# Patient Record
Sex: Female | Born: 1990 | Race: Black or African American | Hispanic: No | Marital: Single | Smoking: Never smoker
Health system: Southern US, Community
[De-identification: ages and names within clinical notes are randomized; demographics above are authoritative.]

## PROBLEM LIST (undated history)

## (undated) DIAGNOSIS — N898 Other specified noninflammatory disorders of vagina: Secondary | ICD-10-CM

## (undated) DIAGNOSIS — N390 Urinary tract infection, site not specified: Secondary | ICD-10-CM

## (undated) DIAGNOSIS — T8859XA Other complications of anesthesia, initial encounter: Secondary | ICD-10-CM

## (undated) HISTORY — DX: Urinary tract infection, site not specified: N39.0

## (undated) HISTORY — DX: Other complications of anesthesia, initial encounter: T88.59XA

## (undated) HISTORY — PX: EYE SURGERY: SHX253

## (undated) HISTORY — PX: HIP SURGERY: SHX245

---
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
## 2013-07-19 HISTORY — PX: HIP SURGERY: SHX245

## 2014-10-14 DIAGNOSIS — S72302A Unspecified fracture of shaft of left femur, initial encounter for closed fracture: Secondary | ICD-10-CM | POA: Insufficient documentation

## 2014-10-14 DIAGNOSIS — S32401A Unspecified fracture of right acetabulum, initial encounter for closed fracture: Secondary | ICD-10-CM | POA: Insufficient documentation

## 2015-05-24 ENCOUNTER — Emergency Department (HOSPITAL_COMMUNITY)
Admission: EM | Admit: 2015-05-24 | Discharge: 2015-05-24 | Disposition: A | Discharge status: Home / Self Care | Payer: Medicaid Other | Attending: Emergency Medicine | Admitting: Emergency Medicine

## 2015-05-24 ENCOUNTER — Encounter (HOSPITAL_COMMUNITY): Payer: Self-pay | Admitting: Emergency Medicine

## 2015-05-24 DIAGNOSIS — Z794 Long term (current) use of insulin: Secondary | ICD-10-CM | POA: Insufficient documentation

## 2015-05-24 DIAGNOSIS — Z87411 Personal history of vaginal dysplasia: Secondary | ICD-10-CM | POA: Diagnosis not present

## 2015-05-24 DIAGNOSIS — S70361A Insect bite (nonvenomous), right thigh, initial encounter: Secondary | ICD-10-CM | POA: Diagnosis present

## 2015-05-24 DIAGNOSIS — E119 Type 2 diabetes mellitus without complications: Secondary | ICD-10-CM | POA: Insufficient documentation

## 2015-05-24 DIAGNOSIS — L02415 Cutaneous abscess of right lower limb: Secondary | ICD-10-CM | POA: Insufficient documentation

## 2015-05-24 DIAGNOSIS — Y998 Other external cause status: Secondary | ICD-10-CM | POA: Diagnosis not present

## 2015-05-24 DIAGNOSIS — Y9289 Other specified places as the place of occurrence of the external cause: Secondary | ICD-10-CM | POA: Insufficient documentation

## 2015-05-24 DIAGNOSIS — L0291 Cutaneous abscess, unspecified: Secondary | ICD-10-CM

## 2015-05-24 DIAGNOSIS — W57XXXA Bitten or stung by nonvenomous insect and other nonvenomous arthropods, initial encounter: Secondary | ICD-10-CM | POA: Insufficient documentation

## 2015-05-24 DIAGNOSIS — Y9389 Activity, other specified: Secondary | ICD-10-CM | POA: Insufficient documentation

## 2015-05-24 HISTORY — DX: Other specified noninflammatory disorders of vagina: N89.8

## 2015-05-24 LAB — CBG MONITORING, ED: GLUCOSE-CAPILLARY: 52 mg/dL — AB (ref 65–99)

## 2015-05-24 MED ORDER — CEPHALEXIN 500 MG PO CAPS: 500.0000 mg | ORAL_CAPSULE | Freq: Four times a day (QID) | ORAL | Status: DC

## 2015-05-24 MED ORDER — LIDOCAINE-EPINEPHRINE (PF) 2 %-1:200000 IJ SOLN: 10.0000 mL | Freq: Once | INTRAMUSCULAR | Status: DC

## 2015-05-24 MED ORDER — IBUPROFEN 800 MG PO TABS: 800.0000 mg | ORAL_TABLET | Freq: Three times a day (TID) | ORAL | Status: DC

## 2015-05-24 MED ORDER — LIDOCAINE-EPINEPHRINE 2 %-1:100000 IJ SOLN
INTRAMUSCULAR | Status: AC
  Administered 2015-05-24: 1 mL
  Filled 2015-05-24: qty 1

## 2015-05-24 NOTE — ED Notes (Signed)
CBG 52. Snack given . Pt continues to be alert, oriented and laughing with friend in room. Snack mgiven  Pt advised x3 to remove young child from rolling chair

## 2015-05-24 NOTE — ED Notes (Signed)
Red raised abscess in r/upper thigh, increasing over last 3 days. Cellulitis approx 5 cm diameter. Abscess of 1.5cm in center. Pt reports drainage from wound over the 12 hours

## 2015-05-24 NOTE — ED Provider Notes (Signed)
CSN: [id number]     Arrival date & time 05/24/15  1103 History   First MD Initiated Contact with Patient 05/24/15 1111     Chief Complaint  Patient presents with  . Insect Bite    pt reports 3 day hx of red raised area on r/upper thigh     HPI   Lisa Barnes is a 24 y.o. female with a PMH of DM who presents to the ED with insect bite to her right upper thigh. She reports she thinks she was bitten by an insect 3-4 days ago, and has developed worsening pain and swelling since that time. She denies exacerbating factors. She has tried hot showers and "pressing" on her wound to drain it for symptom relief. She reports it has been draining "pus" for the past several days. She denies fever, chills, abdominal pain, N/V, numbness, weakness, paresthesia.   Past Medical History  Diagnosis Date  . Diabetes mellitus   . Vaginal cysts    Past Surgical History  Procedure Laterality Date  . Cesarean section    . Hip surgery      r/hip  . Eye surgery     Family History  Problem Relation Age of Onset  . Cancer Mother   . Hypertension Father    Social History  Substance Use Topics  . Smoking status: Never Smoker   . Smokeless tobacco: None  . Alcohol Use: No   OB History    No data available      Review of Systems  Constitutional: Negative for fever and chills.  Gastrointestinal: Negative for nausea, vomiting and abdominal pain.  Skin: Positive for color change and wound.  Neurological: Negative for weakness and numbness.      Allergies  Bactrim  Home Medications   Prior to Admission medications   Medication Sig Start Date End Date Taking? Authorizing Provider  cephALEXin (KEFLEX) 500 MG capsule Take 1 capsule (500 mg total) by mouth 4 (four) times daily. 05/24/15   Mady GemmaElizabeth C Westfall, PA-C  ibuprofen (ADVIL,MOTRIN) 800 MG tablet Take 1 tablet (800 mg total) by mouth 3 (three) times daily. 05/24/15   Mady GemmaElizabeth C Westfall, PA-C  insulin NPH (HUMULIN N,NOVOLIN N) 100 UNIT/ML  injection Inject 10-15 Units into the skin daily. ReliOn Insulin. Sliding Scale    Historical Provider, MD  insulin regular (NOVOLIN R,HUMULIN R) 100 units/mL injection Inject 10-15 Units into the skin 3 (three) times daily before meals. ReliOn Insulin. Sliding Scale    Historical Provider, MD    BP 109/72 mmHg  Pulse 94  Temp(Src) 97.9 F (36.6 C) (Oral)  Resp 16  Wt 152 lb (68.947 kg)  SpO2 100%  LMP 05/09/2015 (Approximate) Physical Exam  Constitutional: She is oriented to person, place, and time. She appears well-developed and well-nourished. No distress.  HENT:  Head: Normocephalic and atraumatic.  Right Ear: External ear normal.  Left Ear: External ear normal.  Nose: Nose normal.  Mouth/Throat: Oropharynx is clear and moist.  Eyes: Conjunctivae and EOM are normal. Pupils are equal, round, and reactive to light. Right eye exhibits no discharge. Left eye exhibits no discharge. No scleral icterus.  Neck: Normal range of motion. Neck supple.  Cardiovascular: Normal rate and regular rhythm.   Pulmonary/Chest: Effort normal and breath sounds normal. No respiratory distress.  Musculoskeletal: Normal range of motion. She exhibits tenderness. She exhibits no edema.  1.5 cm area of induration to right anterior upper thigh with 5 cm area surrounding erythema. Full range of motion of  right lower extremity. Strength and sensation intact. Distal pulses intact.  Neurological: She is alert and oriented to person, place, and time.  Skin: Skin is warm and dry. She is not diaphoretic. There is erythema.  Psychiatric: She has a normal mood and affect. Her behavior is normal.  Nursing note and vitals reviewed.   ED Course  .Marland KitchenIncision and Drainage Date/Time: 05/24/2015 12:39 PM Performed by: Glean Hess C Authorized by: Glean Hess C Consent: Verbal consent obtained. Risks and benefits: risks, benefits and alternatives were discussed Consent given by: patient Patient  understanding: patient states understanding of the procedure being performed Patient consent: the patient's understanding of the procedure matches consent given Procedure consent: procedure consent matches procedure scheduled Relevant documents: relevant documents present and verified Site marked: the operative site was marked Required items: required blood products, implants, devices, and special equipment available Patient identity confirmed: verbally with patient Time out: Immediately prior to procedure a "time out" was called to verify the correct patient, procedure, equipment, support staff and site/side marked as required. Type: abscess Body area: lower extremity Location details: right leg Anesthesia: local infiltration Local anesthetic: lidocaine 1% with epinephrine Anesthetic total: 8 ml Patient sedated: no Scalpel size: 11 Needle gauge: 22 Incision type: single straight Incision depth: dermal Complexity: complex Drainage: purulent Drainage amount: scant Wound treatment: wound left open Packing material: none Patient tolerance: Patient tolerated the procedure well with no immediate complications   Labs Review Labs Reviewed  CBG MONITORING, ED - Abnormal; Notable for the following:    Glucose-Capillary 52 (*)    All other components within normal limits    Imaging Review No results found.   I have personally reviewed and evaluated these lab results as part of my medical decision-making.   EKG Interpretation None      MDM   Final diagnoses:  Abscess    24 year old female presents with abscess to her right upper thigh after being bitten by an insect 3-4 days ago. Denies fever, chills, abdominal pain, nausea, vomiting, numbness, weakness, paresthesia. Reports her blood sugar is well controlled, and that it was 90 this morning.  Patient is afebrile. Vital signs stable. Heart RRR. Lungs clear to auscultation bilaterally. Abdomen soft, non-tender, non-distended.  1.5 cm area of induration to right anterior upper thigh with 5 cm area surrounding erythema. Full range of motion of right lower extremity. Strength and sensation intact. Distal pulses intact.  Glucose in the ED 52, patient is alert and oriented and is laughing with her friend in the exam room, snack given. Incision and drainage performed, which the patient tolerated well.  Given surrounding erythema, will discharge with keflex. Patient to follow-up for wound re-check in 2 days. Return precautions discussed. Advised to change dressing daily and keep clean and dry. Patient verbalizes her understanding and is in agreement with plan.  BP 109/72 mmHg  Pulse 94  Temp(Src) 97.9 F (36.6 C) (Oral)  Resp 16  Wt 152 lb (68.947 kg)  SpO2 100%  LMP 05/09/2015 (Approximate)      Mady Gemma, PA-C 05/24/15 2158  Benjiman Core, MD 05/25/15 [phone]

## 2015-05-24 NOTE — Discharge Instructions (Signed)
1. Medications: keflex, ibuprofen, usual home medications 2. Treatment: rest, drink plenty of fluids, change dressing daily, keep dressing clean and dry 3. Follow Up: please followup in 2 days for wound re-check; please return to the ER for severe pain, persistent bleeding, high fever, new or worsening symptoms   Abscess An abscess is an infected area that contains a collection of pus and debris.It can occur in almost any part of the body. An abscess is also known as a furuncle or boil. CAUSES  An abscess occurs when tissue gets infected. This can occur from blockage of oil or sweat glands, infection of hair follicles, or a minor injury to the skin. As the body tries to fight the infection, pus collects in the area and creates pressure under the skin. This pressure causes pain. People with weakened immune systems have difficulty fighting infections and get certain abscesses more often.  SYMPTOMS Usually an abscess develops on the skin and becomes a painful mass that is red, warm, and tender. If the abscess forms under the skin, you may feel a moveable soft area under the skin. Some abscesses break open (rupture) on their own, but most will continue to get worse without care. The infection can spread deeper into the body and eventually into the bloodstream, causing you to feel ill.  DIAGNOSIS  Your caregiver will take your medical history and perform a physical exam. A sample of fluid may also be taken from the abscess to determine what is causing your infection. TREATMENT  Your caregiver may prescribe antibiotic medicines to fight the infection. However, taking antibiotics alone usually does not cure an abscess. Your caregiver may need to make a small cut (incision) in the abscess to drain the pus. In some cases, gauze is packed into the abscess to reduce pain and to continue draining the area. HOME CARE INSTRUCTIONS   Only take over-the-counter or prescription medicines for pain, discomfort, or  fever as directed by your caregiver.  If you were prescribed antibiotics, take them as directed. Finish them even if you start to feel better.  If gauze is used, follow your caregiver's directions for changing the gauze.  To avoid spreading the infection:  Keep your draining abscess covered with a bandage.  Wash your hands well.  Do not share personal care items, towels, or whirlpools with others.  Avoid skin contact with others.  Keep your skin and clothes clean around the abscess.  Keep all follow-up appointments as directed by your caregiver. SEEK MEDICAL CARE IF:   You have increased pain, swelling, redness, fluid drainage, or bleeding.  You have muscle aches, chills, or a general ill feeling.  You have a fever. MAKE SURE YOU:   Understand these instructions.  Will watch your condition.  Will get help right away if you are not doing well or get worse.   This information is not intended to replace advice given to you by your health care provider. Make sure you discuss any questions you have with your health care provider.   Document Released: 04/14/2005 Document Revised: 01/04/2012 Document Reviewed: 09/17/2011 Elsevier Interactive Patient Education 2016 Elsevier Inc.  Incision and Drainage Incision and drainage is a procedure in which a sac-like structure (cystic structure) is opened and drained. The area to be drained usually contains material such as pus, fluid, or blood.  LET YOUR CAREGIVER KNOW ABOUT:   Allergies to medicine.  Medicines taken, including vitamins, herbs, eyedrops, over-the-counter medicines, and creams.  Use of steroids (by mouth or  creams).  Previous problems with anesthetics or numbing medicines.  History of bleeding problems or blood clots.  Previous surgery.  Other health problems, including diabetes and kidney problems.  Possibility of pregnancy, if this applies. RISKS AND  COMPLICATIONS  Pain.  Bleeding.  Scarring.  Infection. BEFORE THE PROCEDURE  You may need to have an ultrasound or other imaging tests to see how large or deep your cystic structure is. Blood tests may also be used to determine if you have an infection or how severe the infection is. You may need to have a tetanus shot. PROCEDURE  The affected area is cleaned with a cleaning fluid. The cyst area will then be numbed with a medicine (local anesthetic). A small incision will be made in the cystic structure. A syringe or catheter may be used to drain the contents of the cystic structure, or the contents may be squeezed out. The area will then be flushed with a cleansing solution. After cleansing the area, it is often gently packed with a gauze or another wound dressing. Once it is packed, it will be covered with gauze and tape or some other type of wound dressing. AFTER THE PROCEDURE   Often, you will be allowed to go home right after the procedure.  You may be given antibiotic medicine to prevent or heal an infection.  If the area was packed with gauze or some other wound dressing, you will likely need to come back in 1 to 2 days to get it removed.  The area should heal in about 14 days.   This information is not intended to replace advice given to you by your health care provider. Make sure you discuss any questions you have with your health care provider.   Document Released: 12/29/2000 Document Revised: 01/04/2012 Document Reviewed: 08/30/2011 Elsevier Interactive Patient Education [employer]   Emergency Department Resource Guide 1) Find a Doctor and Pay Out of Pocket Although you won't have to find out who is covered by your insurance plan, it is a good idea to ask around and get recommendations. You will then need to call the office and see if the doctor you have chosen will accept you as a new patient and what types of options they offer for patients who are self-pay. Some  doctors offer discounts or will set up payment plans for their patients who do not have insurance, but you will need to ask so you aren't surprised when you get to your appointment.  2) Contact Your Local Health Department Not all health departments have doctors that can see patients for sick visits, but many do, so it is worth a call to see if yours does. If you don't know where your local health department is, you can check in your phone book. The CDC also has a tool to help you locate your state's health department, and many state websites also have listings of all of their local health departments.  3) Find a Walk-in Clinic If your illness is not likely to be very severe or complicated, you may want to try a walk in clinic. These are popping up all over the country in pharmacies, drugstores, and shopping centers. They're usually staffed by nurse practitioners or physician assistants that have been trained to treat common illnesses and complaints. They're usually fairly quick and inexpensive. However, if you have serious medical issues or chronic medical problems, these are probably not your best option.  No Primary Care Doctor: - Call Health Connect at  [phone] - they can help you locate a primary care doctor that  accepts your insurance, provides certain services, etc. - Physician Referral Service- [phone]  Chronic Pain Problems: Organization         Address  Phone   Notes  [facility]  [phone] Patients need to be referred by their primary care doctor.   Medication Assistance: Organization         Address  Phone   Notes  [facility] Medication [facility] [address] [place] [place], [place] [address] [phone] --Must be a resident of [facility] -- Must have NO insurance coverage whatsoever (no Medicaid/ Medicare, etc.) -- The pt. MUST have a primary care doctor that directs their care regularly and follows them in the  community   MedAssist  [phone]   [employer]  [phone]    Agencies that provide inexpensive medical care: Organization         Address  Phone   Notes  [facility]  [phone]   [facility]    [phone]   [facility] [address] [place], [place] [address] [phone]   [facility] [address], [place] [phone]   Planned Parenthood    [phone]   [facility]    [phone]   [facility]  [address], [place] Phone:  [phone], Fax:  [phone] Hours of Operation:  9 am - 6 pm, M-F.  Also accepts Medicaid/Medicare and self-pay.  [facility]  [address], [place] Phone: [phone], Fax: [phone]. Hours of Operation:  8:30 am - 5:30 pm, M-F.  Also accepts Medicaid and self-pay.  [facility] [place] [address] [place] Phone: [phone]   [facility] [address] Natasha Bence [place], [place] [phone], Ext. [phone] Mondays & Thursdays: 7-9 AM.  First 15 patients are seen on a first come, first serve basis.    Medicaid-accepting [facility] Providers:  Organization         Address  Phone   Notes  [facility] [address], [place] [phone] Also accepts self-pay patients.  [facility] [address] Laurell Josephs [place], [place]  [phone]   [facility] [address], [place] [phone]   [facility] [address] [place] [phone]   Renaye Rakers [address], [place]   [phone] Only accepts [place] patients after they have their name applied to their card.   Self-Pay (no insurance) in [facility]:  Organization         Address  Phone   Notes  Sickle Cell Patients, [facility]  Internal Medicine [address] [place], [place] [phone]   [facility] [address] [place], [place] [phone]   [facility] [place]  [address], [place] [phone]   [facility]/Dr. Osei-Bonsu  [address], [place] or [address], [place] [phone] Phone number for both [place] and [place] locations is the same.  [facility] [address], [place] [phone]   [facility] [address], [place]  or [address] [phone]   [facility] [address] [place] [phone], phone; [phone], fax Sees patients 1st and 3rd Saturday of every month.  Must not qualify for public or private insurance (i.e. Medicaid, Medicare, [place] Health Choice, Veterans' Benefits)  Household income should be no more than 200% of the poverty level The clinic cannot treat you if you are pregnant or think you are pregnant  Sexually transmitted diseases are not treated at the clinic.    Dental Care: Organization         Address  Phone  Notes  [facility] Department of [facility] [address] [place], [place] [phone] Accepts children up to age 55 who are enrolled in [place] or [place] Health Choice; pregnant women with a Medicaid card; and children who have applied for Medicaid or [place] Health Choice, but were declined, whose parents can pay a reduced fee at time of service.  [facility] Department of [facility]  [address], [place] [phone] Accepts children up to age 67 who are enrolled in [place] or [place] Health Choice; pregnant women with a Medicaid card; and children who have applied for Medicaid or [place] Health Choice, but were declined, whose parents can pay a reduced fee at time of service.  [facility]  [address] [place], [place] [phone] Patients are seen by appointment only. Walk-ins are not accepted. [facility] will see patients 22 years of age and older. Monday - Tuesday (8am-5pm) Most Wednesdays (8:30-5pm) $30 per visit, cash only  [facility] Adult Dental Access PRO[facility]  [address], [facility] [phone] Patients are seen by appointment only. Walk-ins are not accepted. [facility] will see patients 74 years of age and older. One Wednesday Evening (Monthly: Volunteer Based).  $30 per visit, cash only  [employer]  [phone] for adults; Children under age 34, call Graduate Pediatric Dentistry at [phone]. Children aged 10-14, please call [phone] to request a pediatric application.  Dental services are provided in all areas of dental care including fillings, crowns and bridges, complete and partial dentures, implants, gum treatment, root canals, and extractions. Preventive care is also provided. Treatment is provided to both adults and children. Patients are selected via a lottery and there is often a waiting list.   [facility] [address] [place]  [phone] [email or web address]   [facility] [address] [place], [place] [phone], Ext. [phone] Second and Fourth Thursday of each month, opens at 6:30 AM; Clinic ends at 9 AM.  Patients are seen on a first-come first-served basis, and a limited number are seen during each clinic.   [facility]  [address] Ether Griffins [place], [place] [phone]   Eligibility Requirements You must have lived in [place], [place], or [place] counties for at least the last three months.   You cannot be eligible for state or federal sponsored [place], including [employer], [place], or [employer].   You generally cannot be eligible for healthcare insurance through your employer.    How to apply: Eligibility screenings are held every  Tuesday and Wednesday afternoon from 1:00 pm until 4:00 pm. You do not need an appointment for the interview!  [facility] [address] [place], [place] [phone]   [facility] Department  [phone]  [facility]  [phone]   [facility]  [phone]    Behavioral Health Resources in the Community: Intensive Outpatient Programs Organization         Address  Phone  Notes  [facility] [address], [place], [place] [phone]   [facility] [address], [place], [place] [phone]   [facility] [address], [place], [place]  [phone]   [facility] [address],  [place], [place] [phone] or [phone]   Substance Abuse Resources Organization         Address  Phone  Notes  [facility]  [phone]   [facility]  [phone]   [facility]  [phone]   [facility]  [phone]   Residential & Outpatient Substance Abuse Program  [phone]   Psychological Services Organization         Address  Phone  Notes  [facility]  [phone]- [phone]   [facility]  [phone]   [facility] [address], [place] [phone] or [phone]    Mobile Crisis Teams Organization         Address  Phone  Notes  [facility], Mobile Crisis Care Unit  [phone]   [facility]  [address] [place], [place] [phone]   Doristine LocksSharon DeEsch [address] [place] [phone]    Self-Help/Support Groups Organization         Address  Phone             Notes  [facility] [place] - variety of support groups  336- [phone] Call for more information  Narcotics Anonymous (NA), [facility] [address], [place]  2 meetings at this location    Statisticianesidential Treatment Programs Organization         Address  Phone  Notes  [facility] [address],    [place]  [phone]   [facility]  [address], [place] [phone], [place], [place] [phone]   [facility] [address], [place] [phone] Admissions: 8am-3pm M-F  [facility] [address]    [place], [place] [phone]   [facility] [address], [place], [place] [phone]   [facility] [address]  [place], [place] [phone]   [facility] - Intensive Outpatient [address], [place], [place] [phone]   [facility] ([facility] [address]  [place], [place] [phone] or [phone]   [facility] ([facility]) [address] [place], [place] [phone] Accepts Medicaid  [facility] [address]  [place] [phone] Substance Abuse/Addiction Treatment   [facility] Resources Organization         Address  Phone  Notes  [facility]  [phone]) [phone]   Angie FavaJulie Brannon, PhD [address] [place], [place]   [phone] or [phone]   [facility]   [address] [place], [place] [phone]   [facility] [address], [place], [place] [phone] Insurance/Medicaid/sponsorship through [employer] [facility]  Faith and Families [address]  [place], [place] [phone] [place], [place] [phone]    Dr. Adele Schilder  [phone]   [facility] Dept. 1) [address], [place] 2) [place] 3)  [address], [place] [phone]  [phone]   [place] [phone] or [phone] (After  Hours)

## 2016-01-30 ENCOUNTER — Emergency Department (HOSPITAL_COMMUNITY): Payer: Medicaid Other

## 2016-01-30 ENCOUNTER — Emergency Department (HOSPITAL_COMMUNITY)
Admission: EM | Admit: 2016-01-30 | Discharge: 2016-01-30 | Disposition: A | Discharge status: Home / Self Care | Payer: Medicaid Other | Attending: Emergency Medicine | Admitting: Emergency Medicine

## 2016-01-30 ENCOUNTER — Encounter (HOSPITAL_COMMUNITY): Payer: Self-pay | Admitting: Emergency Medicine

## 2016-01-30 DIAGNOSIS — E119 Type 2 diabetes mellitus without complications: Secondary | ICD-10-CM | POA: Diagnosis not present

## 2016-01-30 DIAGNOSIS — S01112A Laceration without foreign body of left eyelid and periocular area, initial encounter: Secondary | ICD-10-CM | POA: Diagnosis present

## 2016-01-30 DIAGNOSIS — S20319A Abrasion of unspecified front wall of thorax, initial encounter: Secondary | ICD-10-CM | POA: Diagnosis not present

## 2016-01-30 DIAGNOSIS — Y939 Activity, unspecified: Secondary | ICD-10-CM | POA: Diagnosis not present

## 2016-01-30 DIAGNOSIS — Z794 Long term (current) use of insulin: Secondary | ICD-10-CM | POA: Diagnosis not present

## 2016-01-30 DIAGNOSIS — Y999 Unspecified external cause status: Secondary | ICD-10-CM | POA: Diagnosis not present

## 2016-01-30 DIAGNOSIS — R791 Abnormal coagulation profile: Secondary | ICD-10-CM | POA: Insufficient documentation

## 2016-01-30 DIAGNOSIS — Y92410 Unspecified street and highway as the place of occurrence of the external cause: Secondary | ICD-10-CM | POA: Diagnosis not present

## 2016-01-30 DIAGNOSIS — R51 Headache: Secondary | ICD-10-CM | POA: Diagnosis not present

## 2016-01-30 LAB — URINALYSIS, ROUTINE W REFLEX MICROSCOPIC
Bilirubin Urine: NEGATIVE
LEUKOCYTES UA: NEGATIVE
Nitrite: NEGATIVE
PROTEIN: NEGATIVE mg/dL
Specific Gravity, Urine: 1.029 (ref 1.005–1.030)
pH: 6 (ref 5.0–8.0)

## 2016-01-30 LAB — COMPREHENSIVE METABOLIC PANEL
ALK PHOS: 68 U/L (ref 38–126)
ALT: 15 U/L (ref 14–54)
ANION GAP: 8 (ref 5–15)
AST: 22 U/L (ref 15–41)
Albumin: 3.7 g/dL (ref 3.5–5.0)
BILIRUBIN TOTAL: 1 mg/dL (ref 0.3–1.2)
BUN: 10 mg/dL (ref 6–20)
CALCIUM: 9.2 mg/dL (ref 8.9–10.3)
CO2: 22 mmol/L (ref 22–32)
CREATININE: 0.76 mg/dL (ref 0.44–1.00)
Chloride: 106 mmol/L (ref 101–111)
Glucose, Bld: 341 mg/dL — ABNORMAL HIGH (ref 65–99)
Potassium: 4 mmol/L (ref 3.5–5.1)
SODIUM: 136 mmol/L (ref 135–145)
TOTAL PROTEIN: 6.9 g/dL (ref 6.5–8.1)

## 2016-01-30 LAB — ETHANOL

## 2016-01-30 LAB — CBC
HCT: 36.7 % (ref 36.0–46.0)
HEMOGLOBIN: 11.9 g/dL — AB (ref 12.0–15.0)
MCH: 30.5 pg (ref 26.0–34.0)
MCHC: 32.4 g/dL (ref 30.0–36.0)
MCV: 94.1 fL (ref 78.0–100.0)
PLATELETS: 351 10*3/uL (ref 150–400)
RBC: 3.9 MIL/uL (ref 3.87–5.11)
RDW: 13.2 % (ref 11.5–15.5)
WBC: 6.6 10*3/uL (ref 4.0–10.5)

## 2016-01-30 LAB — PROTIME-INR
INR: 1.05 (ref 0.00–1.49)
Prothrombin Time: 13.9 seconds (ref 11.6–15.2)

## 2016-01-30 LAB — URINE MICROSCOPIC-ADD ON

## 2016-01-30 LAB — CBG MONITORING, ED: GLUCOSE-CAPILLARY: 401 mg/dL — AB (ref 65–99)

## 2016-01-30 LAB — I-STAT CG4 LACTIC ACID, ED: LACTIC ACID, VENOUS: 0.83 mmol/L (ref 0.5–1.9)

## 2016-01-30 MED ORDER — NEOMYCIN-POLYMYXIN-DEXAMETH 0.1 % OP OINT: 1.0000 "application " | TOPICAL_OINTMENT | Freq: Two times a day (BID) | OPHTHALMIC | Status: AC

## 2016-01-30 MED ORDER — ONDANSETRON HCL 4 MG/2ML IJ SOLN
4.0000 mg | Freq: Once | INTRAMUSCULAR | Status: AC
  Administered 2016-01-30: 4 mg via INTRAVENOUS
  Filled 2016-01-30: qty 2

## 2016-01-30 MED ORDER — LIDOCAINE-EPINEPHRINE (PF) 2 %-1:200000 IJ SOLN
10.0000 mL | Freq: Once | INTRAMUSCULAR | Status: AC
  Administered 2016-01-30: 10 mL

## 2016-01-30 MED ORDER — INSULIN ASPART 100 UNIT/ML ~~LOC~~ SOLN
30.0000 [IU] | Freq: Once | SUBCUTANEOUS | Status: AC
  Administered 2016-01-30: 30 [IU] via INTRAVENOUS
  Filled 2016-01-30: qty 1

## 2016-01-30 MED ORDER — MORPHINE SULFATE (PF) 4 MG/ML IV SOLN
8.0000 mg | INTRAVENOUS | Status: AC | PRN
  Administered 2016-01-30 (×2): 8 mg via INTRAVENOUS
  Filled 2016-01-30 (×2): qty 2

## 2016-01-30 MED ORDER — MORPHINE SULFATE 15 MG PO TABS: 15.0000 mg | ORAL_TABLET | Freq: Four times a day (QID) | ORAL | Status: DC | PRN

## 2016-01-30 NOTE — ED Notes (Signed)
Pt ask for food and drink, MD consulted if possible for pt to have drinks or food, pt given 2 cup of water af requested a lunch bag and one diet sprite, pt started vomiting on the floor even when she had and emesis bag on her hand, pt encouraged to don't drink or eat after vomiting and pt still keep drinking and eating her food states she vomited because she was feeling hot not because she couldn't tolerated food, room cleaned after her vomiting and house keeping service requested, pt and family member started making comments like " there is some rude staff on this emergency room and complaining that she wait longer for food even when pt oriented multiple times that we need to have her clear from MD first before we can give any drink or food. Pt keep calling over and over for the same reason and becoming demanding against NT, NS and RN.
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
## 2016-01-30 NOTE — ED Notes (Signed)
Pt unable to void at this time. 

## 2016-01-30 NOTE — ED Notes (Signed)
Ophthalmology a the bedside.

## 2016-01-30 NOTE — Discharge Instructions (Signed)
Facial Laceration ° A facial laceration is a cut on the face. These injuries can be painful and cause bleeding. Lacerations usually heal quickly, but they need special care to reduce scarring. °DIAGNOSIS  °Your health care provider will take a medical history, ask for details about how the injury occurred, and examine the wound to determine how deep the cut is. °TREATMENT  °Some facial lacerations may not require closure. Others may not be able to be closed because of an increased risk of infection. The risk of infection and the chance for successful closure will depend on various factors, including the amount of time since the injury occurred. °The wound may be cleaned to help prevent infection. If closure is appropriate, pain medicines may be given if needed. Your health care provider will use stitches (sutures), wound glue (adhesive), or skin adhesive strips to repair the laceration. These tools bring the skin edges together to allow for faster healing and a better cosmetic outcome. If needed, you may also be given a tetanus shot. °HOME CARE INSTRUCTIONS °· Only take over-the-counter or prescription medicines as directed by your health care provider. °· Follow your health care provider's instructions for wound care. These instructions will vary depending on the technique used for closing the wound. °For Sutures: °· Keep the wound clean and dry.   °· If you were given a bandage (dressing), you should change it at least once a day. Also change the dressing if it becomes wet or dirty, or as directed by your health care provider.   °· Wash the wound with soap and water 2 times a day. Rinse the wound off with water to remove all soap. Pat the wound dry with a clean towel.   °· After cleaning, apply a thin layer of the antibiotic ointment recommended by your health care provider. This will help prevent infection and keep the dressing from sticking.   °· You may shower as usual after the first 24 hours. Do not soak the  wound in water until the sutures are removed.   °· Get your sutures removed as directed by your health care provider. With facial lacerations, sutures should usually be taken out after 4-5 days to avoid stitch marks.   °· Wait a few days after your sutures are removed before applying any makeup. °For Skin Adhesive Strips: °· Keep the wound clean and dry.   °· Do not get the skin adhesive strips wet. You may bathe carefully, using caution to keep the wound dry.   °· If the wound gets wet, pat it dry with a clean towel.   °· Skin adhesive strips will fall off on their own. You may trim the strips as the wound heals. Do not remove skin adhesive strips that are still stuck to the wound. They will fall off in time.   °For Wound Adhesive: °· You may briefly wet your wound in the shower or bath. Do not soak or scrub the wound. Do not swim. Avoid periods of heavy sweating until the skin adhesive has fallen off on its own. After showering or bathing, gently pat the wound dry with a clean towel.   °· Do not apply liquid medicine, cream medicine, ointment medicine, or makeup to your wound while the skin adhesive is in place. This may loosen the film before your wound is healed.   °· If a dressing is placed over the wound, be careful not to apply tape directly over the skin adhesive. This may cause the adhesive to be pulled off before the wound is healed.   °· Avoid   prolonged exposure to sunlight or tanning lamps while the skin adhesive is in place. °· The skin adhesive will usually remain in place for 5-10 days, then naturally fall off the skin. Do not pick at the adhesive film.   °After Healing: °Once the wound has healed, cover the wound with sunscreen during the day for 1 full year. This can help minimize scarring. Exposure to ultraviolet light in the first year will darken the scar. It can take 1-2 years for the scar to lose its redness and to heal completely.  °SEEK MEDICAL CARE IF: °· You have a fever. °SEEK IMMEDIATE  MEDICAL CARE IF: °· You have redness, pain, or swelling around the wound.   °· You see a yellowish-white fluid (pus) coming from the wound.   °  °This information is not intended to replace advice given to you by your health care provider. Make sure you discuss any questions you have with your health care provider. °  °Document Released: 08/12/2004 Document Revised: 07/26/2014 Document Reviewed: 02/15/2013 °Elsevier Interactive Patient Education ©2016 [employer] ° °
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
## 2016-01-30 NOTE — ED Notes (Signed)
This RN entered room to finc C-collar off.  Pt stated she had removed collar d/t discomfort.  THis RN educated pt on importance of maintaining C spine stability until cleared by CT scan.  Pt expressed understanding.  This RN with McKenzie, RN, re-applied collar while maintaining C-spine stability.

## 2016-01-30 NOTE — ED Notes (Signed)
Pt to CT at this time.

## 2016-01-31 ENCOUNTER — Inpatient Hospital Stay (HOSPITAL_COMMUNITY)
Admission: EM | Admit: 2016-01-31 | Discharge: 2016-01-31 | DRG: 639 | Discharge status: Against Medical Advice | Payer: Medicaid Other | Attending: Internal Medicine | Admitting: Internal Medicine

## 2016-01-31 ENCOUNTER — Encounter (HOSPITAL_COMMUNITY): Payer: Self-pay | Admitting: Emergency Medicine

## 2016-01-31 DIAGNOSIS — E109 Type 1 diabetes mellitus without complications: Secondary | ICD-10-CM

## 2016-01-31 DIAGNOSIS — Z79899 Other long term (current) drug therapy: Secondary | ICD-10-CM | POA: Diagnosis not present

## 2016-01-31 DIAGNOSIS — S0181XD Laceration without foreign body of other part of head, subsequent encounter: Secondary | ICD-10-CM | POA: Diagnosis not present

## 2016-01-31 DIAGNOSIS — Z883 Allergy status to other anti-infective agents status: Secondary | ICD-10-CM

## 2016-01-31 DIAGNOSIS — Z794 Long term (current) use of insulin: Secondary | ICD-10-CM | POA: Diagnosis not present

## 2016-01-31 DIAGNOSIS — E1010 Type 1 diabetes mellitus with ketoacidosis without coma: Secondary | ICD-10-CM | POA: Diagnosis present

## 2016-01-31 DIAGNOSIS — S42001D Fracture of unspecified part of right clavicle, subsequent encounter for fracture with routine healing: Secondary | ICD-10-CM

## 2016-01-31 DIAGNOSIS — E1110 Type 2 diabetes mellitus with ketoacidosis without coma: Secondary | ICD-10-CM | POA: Diagnosis present

## 2016-01-31 LAB — I-STAT VENOUS BLOOD GAS, ED
ACID-BASE DEFICIT: 10 mmol/L — AB (ref 0.0–2.0)
Bicarbonate: 18.5 mEq/L — ABNORMAL LOW (ref 20.0–24.0)
O2 Saturation: 14 %
TCO2: 20 mmol/L (ref 0–100)
pCO2, Ven: 51 mmHg — ABNORMAL HIGH (ref 45.0–50.0)
pH, Ven: 7.167 — CL (ref 7.250–7.300)
pO2, Ven: 16 mmHg — ABNORMAL LOW (ref 31.0–45.0)

## 2016-01-31 LAB — CBC WITH DIFFERENTIAL/PLATELET
BASOS ABS: 0 10*3/uL (ref 0.0–0.1)
BASOS PCT: 0 %
EOS ABS: 0 10*3/uL (ref 0.0–0.7)
Eosinophils Relative: 0 %
HEMATOCRIT: 40.2 % (ref 36.0–46.0)
Hemoglobin: 12.7 g/dL (ref 12.0–15.0)
Lymphocytes Relative: 21 %
Lymphs Abs: 2.2 10*3/uL (ref 0.7–4.0)
MCH: 30.5 pg (ref 26.0–34.0)
MCHC: 31.6 g/dL (ref 30.0–36.0)
MCV: 96.4 fL (ref 78.0–100.0)
MONO ABS: 0.7 10*3/uL (ref 0.1–1.0)
Monocytes Relative: 7 %
NEUTROS ABS: 7.5 10*3/uL (ref 1.7–7.7)
Neutrophils Relative %: 72 %
PLATELETS: 398 10*3/uL (ref 150–400)
RBC: 4.17 MIL/uL (ref 3.87–5.11)
RDW: 13.4 % (ref 11.5–15.5)
WBC: 10.4 10*3/uL (ref 4.0–10.5)

## 2016-01-31 LAB — BASIC METABOLIC PANEL
ANION GAP: 8 (ref 5–15)
Anion gap: 11 (ref 5–15)
Anion gap: 5 (ref 5–15)
BUN: 11 mg/dL (ref 6–20)
BUN: 8 mg/dL (ref 6–20)
BUN: 6 mg/dL (ref 6–20)
CALCIUM: 9.1 mg/dL (ref 8.9–10.3)
CALCIUM: 8.6 mg/dL — AB (ref 8.9–10.3)
CHLORIDE: 111 mmol/L (ref 101–111)
CO2: 16 mmol/L — AB (ref 22–32)
CO2: 21 mmol/L — AB (ref 22–32)
CO2: 20 mmol/L — AB (ref 22–32)
CREATININE: 1 mg/dL (ref 0.44–1.00)
CREATININE: 0.64 mg/dL (ref 0.44–1.00)
Calcium: 8.5 mg/dL — ABNORMAL LOW (ref 8.9–10.3)
Chloride: 107 mmol/L (ref 101–111)
Chloride: 105 mmol/L (ref 101–111)
Creatinine, Ser: 0.71 mg/dL (ref 0.44–1.00)
GFR calc Af Amer: >60 mL/min (ref >60)
GFR calc non Af Amer: >60 mL/min (ref >60)
GFR calc non Af Amer: >60 mL/min (ref >60)
GLUCOSE: 272 mg/dL — AB (ref 65–99)
GLUCOSE: 161 mg/dL — AB (ref 65–99)
GLUCOSE: 296 mg/dL — AB (ref 65–99)
POTASSIUM: 4.3 mmol/L (ref 3.5–5.1)
Potassium: 3.9 mmol/L (ref 3.5–5.1)
Potassium: 4.2 mmol/L (ref 3.5–5.1)
Sodium: 134 mmol/L — ABNORMAL LOW (ref 135–145)
Sodium: 137 mmol/L (ref 135–145)
Sodium: 133 mmol/L — ABNORMAL LOW (ref 135–145)

## 2016-01-31 LAB — URINE MICROSCOPIC-ADD ON

## 2016-01-31 LAB — GLUCOSE, CAPILLARY
GLUCOSE-CAPILLARY: 193 mg/dL — AB (ref 65–99)
GLUCOSE-CAPILLARY: 159 mg/dL — AB (ref 65–99)
GLUCOSE-CAPILLARY: 134 mg/dL — AB (ref 65–99)
GLUCOSE-CAPILLARY: 194 mg/dL — AB (ref 65–99)
GLUCOSE-CAPILLARY: 587 mg/dL — AB (ref 65–99)
Glucose-Capillary: 274 mg/dL — ABNORMAL HIGH (ref 65–99)
Glucose-Capillary: 137 mg/dL — ABNORMAL HIGH (ref 65–99)
Glucose-Capillary: >600 mg/dL (ref 65–99)

## 2016-01-31 LAB — COMPREHENSIVE METABOLIC PANEL
ALBUMIN: 3.9 g/dL (ref 3.5–5.0)
ALT: 19 U/L (ref 14–54)
AST: 22 U/L (ref 15–41)
Alkaline Phosphatase: 80 U/L (ref 38–126)
Anion gap: 19 — ABNORMAL HIGH (ref 5–15)
BILIRUBIN TOTAL: 1.1 mg/dL (ref 0.3–1.2)
BUN: 13 mg/dL (ref 6–20)
CHLORIDE: 100 mmol/L — AB (ref 101–111)
CO2: 15 mmol/L — AB (ref 22–32)
Calcium: 9.9 mg/dL (ref 8.9–10.3)
Creatinine, Ser: 1.03 mg/dL — ABNORMAL HIGH (ref 0.44–1.00)
GFR calc Af Amer: >60 mL/min (ref >60)
GFR calc non Af Amer: >60 mL/min (ref >60)
GLUCOSE: 435 mg/dL — AB (ref 65–99)
POTASSIUM: 5 mmol/L (ref 3.5–5.1)
SODIUM: 134 mmol/L — AB (ref 135–145)
Total Protein: 7.5 g/dL (ref 6.5–8.1)

## 2016-01-31 LAB — CBG MONITORING, ED
GLUCOSE-CAPILLARY: 443 mg/dL — AB (ref 65–99)
Glucose-Capillary: 375 mg/dL — ABNORMAL HIGH (ref 65–99)
Glucose-Capillary: 352 mg/dL — ABNORMAL HIGH (ref 65–99)

## 2016-01-31 LAB — URINALYSIS, ROUTINE W REFLEX MICROSCOPIC
BILIRUBIN URINE: NEGATIVE
Hgb urine dipstick: NEGATIVE
LEUKOCYTES UA: NEGATIVE
NITRITE: NEGATIVE
PROTEIN: NEGATIVE mg/dL
Specific Gravity, Urine: 1.025 (ref 1.005–1.030)
pH: 5.5 (ref 5.0–8.0)

## 2016-01-31 LAB — LIPASE, BLOOD: LIPASE: 15 U/L (ref 11–51)

## 2016-01-31 LAB — MRSA PCR SCREENING: MRSA by PCR: POSITIVE — AB

## 2016-01-31 LAB — POCT PREGNANCY, URINE: PREG TEST UR: NEGATIVE

## 2016-01-31 LAB — BETA-HYDROXYBUTYRIC ACID: Beta-Hydroxybutyric Acid: 1.29 mmol/L — ABNORMAL HIGH (ref 0.05–0.27)

## 2016-01-31 MED ORDER — SODIUM CHLORIDE 0.9 % IV BOLUS (SEPSIS)
1000.0000 mL | Freq: Once | INTRAVENOUS | Status: AC
  Administered 2016-01-31: 1000 mL via INTRAVENOUS

## 2016-01-31 MED ORDER — CHLORHEXIDINE GLUCONATE CLOTH 2 % EX PADS: 6.0000 | MEDICATED_PAD | Freq: Every day | CUTANEOUS | Status: DC

## 2016-01-31 MED ORDER — INSULIN GLARGINE 100 UNIT/ML ~~LOC~~ SOLN
10.0000 [IU] | Freq: Once | SUBCUTANEOUS | Status: AC
  Administered 2016-01-31: 10 [IU] via SUBCUTANEOUS
  Filled 2016-01-31 (×2): qty 0.1

## 2016-01-31 MED ORDER — ONDANSETRON HCL 4 MG/2ML IJ SOLN: 4.0000 mg | Freq: Three times a day (TID) | INTRAMUSCULAR | Status: AC | PRN

## 2016-01-31 MED ORDER — INSULIN GLARGINE 100 UNIT/ML ~~LOC~~ SOLN
10.0000 [IU] | Freq: Once | SUBCUTANEOUS | Status: DC
  Filled 2016-01-31: qty 0.1

## 2016-01-31 MED ORDER — MUPIROCIN 2 % EX OINT
1.0000 "application " | TOPICAL_OINTMENT | Freq: Two times a day (BID) | CUTANEOUS | Status: DC
  Administered 2016-01-31: 1 via NASAL

## 2016-01-31 MED ORDER — ACETAMINOPHEN 650 MG RE SUPP
650.0000 mg | Freq: Four times a day (QID) | RECTAL | Status: DC | PRN
Start: 2016-01-31 — End: 2016-02-01

## 2016-01-31 MED ORDER — INSULIN ASPART 100 UNIT/ML ~~LOC~~ SOLN: 0.0000 [IU] | Freq: Every day | SUBCUTANEOUS | Status: DC

## 2016-01-31 MED ORDER — DEXTROSE-NACL 5-0.45 % IV SOLN: INTRAVENOUS | Status: DC

## 2016-01-31 MED ORDER — ACETAMINOPHEN 325 MG PO TABS: 650.0000 mg | ORAL_TABLET | Freq: Four times a day (QID) | ORAL | Status: DC | PRN

## 2016-01-31 MED ORDER — HYDROMORPHONE HCL 1 MG/ML IJ SOLN
1.0000 mg | Freq: Once | INTRAMUSCULAR | Status: AC
  Administered 2016-01-31: 1 mg via INTRAVENOUS
  Filled 2016-01-31: qty 1

## 2016-01-31 MED ORDER — INSULIN REGULAR HUMAN 100 UNIT/ML IJ SOLN
INTRAMUSCULAR | Status: DC
  Administered 2016-01-31: 3.2 [IU]/h via INTRAVENOUS
  Filled 2016-01-31: qty 2.5

## 2016-01-31 MED ORDER — NEOMYCIN-POLYMYXIN-DEXAMETH 0.1 % OP OINT
1.0000 "application " | TOPICAL_OINTMENT | Freq: Two times a day (BID) | OPHTHALMIC | Status: DC
  Administered 2016-01-31: 1 via OPHTHALMIC
  Filled 2016-01-31: qty 3.5

## 2016-01-31 MED ORDER — HYDROCODONE-ACETAMINOPHEN 5-325 MG PO TABS
1.0000 | ORAL_TABLET | ORAL | Status: DC | PRN
  Administered 2016-01-31: 2 via ORAL
  Filled 2016-01-31: qty 2

## 2016-01-31 MED ORDER — INSULIN ASPART 100 UNIT/ML ~~LOC~~ SOLN
0.0000 [IU] | Freq: Three times a day (TID) | SUBCUTANEOUS | Status: DC
  Administered 2016-01-31: 2 [IU] via SUBCUTANEOUS

## 2016-01-31 MED ORDER — ENOXAPARIN SODIUM 40 MG/0.4ML ~~LOC~~ SOLN
40.0000 mg | SUBCUTANEOUS | Status: DC
  Filled 2016-01-31: qty 0.4

## 2016-01-31 MED ORDER — KCL IN DEXTROSE-NACL 20-5-0.45 MEQ/L-%-% IV SOLN
INTRAVENOUS | Status: DC
  Administered 2016-01-31: 12:00:00 via INTRAVENOUS
  Filled 2016-01-31: qty 1000

## 2016-01-31 MED ORDER — SODIUM CHLORIDE 0.9 % IV SOLN
INTRAVENOUS | Status: DC
  Administered 2016-01-31: 07:00:00 via INTRAVENOUS

## 2016-01-31 MED ORDER — SODIUM CHLORIDE 0.9% FLUSH: 3.0000 mL | Freq: Two times a day (BID) | INTRAVENOUS | Status: DC

## 2016-01-31 MED ORDER — ONDANSETRON HCL 4 MG/2ML IJ SOLN
4.0000 mg | Freq: Once | INTRAMUSCULAR | Status: AC
  Administered 2016-01-31: 4 mg via INTRAVENOUS
  Filled 2016-01-31: qty 2

## 2016-01-31 NOTE — Progress Notes (Signed)
Pt has decided to leave AMA. DO Lisa Barnes has been informed and attempted to talk to pt also, without success. AMA paperwork has been signed and placed in chart, IV discontinued, pt transported to car via wheelchair. Family friend at bedside.

## 2016-01-31 NOTE — Progress Notes (Signed)
Nurse tech completed CBG checks pt BS 587, nurse rechecked BS and monitor states that BS is HI. Pt stated that she does not want to stay at the hospital because we are not giving her the right amount of insulin that she takes at home. Pt says she is leaving AMA. Nurse attempted to educate the patient about what the hospital process is, but pt does not understand and does not want to hear anymore about it. Nurse paged Internal Medicine pager and explain to the on-call the situation and if he would send someone to talk to pt about the current situation. No orders given at the time. DO wallace at bedisde

## 2016-01-31 NOTE — Progress Notes (Signed)
Called nurse around 1:30 to stop insulin drip in 2 hrs.
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
## 2016-01-31 NOTE — Progress Notes (Signed)
200 ml of Insulin wasted in sink, tubing disposed of properly. Verified with Kenney Housemananya RN

## 2016-01-31 NOTE — ED Notes (Signed)
POCT CBG resulted 352; Brooke, RN aware
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
## 2016-01-31 NOTE — Progress Notes (Signed)
Nurse notified Drs. Earlene PlaterWallace and Waller Marcussen of patient's elevated blood glucose at 9:30 PM. Blood glucose at this time was 587. Repeat blood glucose resulted with meter reading of "high". Patient was on sensitive sliding scale insulin 0-5 units before bed for blood glucose less than 400. Nurse notified MDs according to sliding scale protocol for blood glucose greater than 400. Patient received 10 units of Lantus insulin around 2 PM this afternoon after completing insulin drip. Then received another 2 units of NovoLog insulin around 4 PM according to her mealtime sliding scale. Her blood glucoses at this time were 137 and 194 respectively. According to the nurse the patient ate a high carbohydrate load of bread and cake for dinner prior to her evening blood glucose reading.  Nurse noted at this time that patient wished to leave AMA because patient is "not receiving the proper dose of insulin like she receives at home". Drs. Earlene PlaterWallace and Milena Liggett went to bedside speak with patient to educate patient about hospital protocol for DKA and sliding scale insulin regimen. Patient was notified about the risks of hyperglycemia if she should decide to leave the hospital AMA. Patient reports that she still wishes to leave the hospital in order to administer her own insulin. Drs. Earlene PlaterWallace and Amy Gothard offered patient NovoLog insulin to address acute hyperglycemia prior to her leaving the hospital, however patient refused. Drs. Earlene PlaterWallace and Lusia Greis notified the nurse of the patient's wishes to leave AMA. Patient was discharged AGAINST MEDICAL ADVICE.

## 2016-01-31 NOTE — ED Notes (Signed)
Patient up ambulatory without any difficulty or distress to the bathroom at this time to attempt to obtain an urine specimen

## 2016-02-01 LAB — HIV ANTIBODY (ROUTINE TESTING W REFLEX): HIV SCREEN 4TH GENERATION: NONREACTIVE

## 2016-02-03 ENCOUNTER — Emergency Department (HOSPITAL_COMMUNITY)
Admission: EM | Admit: 2016-02-03 | Discharge: 2016-02-03 | Disposition: A | Discharge status: Home / Self Care | Payer: Medicaid Other | Attending: Emergency Medicine | Admitting: Emergency Medicine

## 2016-02-03 ENCOUNTER — Encounter (HOSPITAL_COMMUNITY): Payer: Self-pay | Admitting: Emergency Medicine

## 2016-02-03 DIAGNOSIS — E119 Type 2 diabetes mellitus without complications: Secondary | ICD-10-CM | POA: Insufficient documentation

## 2016-02-03 DIAGNOSIS — Z794 Long term (current) use of insulin: Secondary | ICD-10-CM | POA: Diagnosis not present

## 2016-02-03 DIAGNOSIS — N390 Urinary tract infection, site not specified: Secondary | ICD-10-CM | POA: Diagnosis not present

## 2016-02-03 DIAGNOSIS — N898 Other specified noninflammatory disorders of vagina: Secondary | ICD-10-CM | POA: Diagnosis present

## 2016-02-03 LAB — COMPREHENSIVE METABOLIC PANEL
ALK PHOS: 78 U/L (ref 38–126)
ALT: 17 U/L (ref 14–54)
ANION GAP: 8 (ref 5–15)
AST: 19 U/L (ref 15–41)
Albumin: 4 g/dL (ref 3.5–5.0)
BUN: 5 mg/dL — ABNORMAL LOW (ref 6–20)
CALCIUM: 9.1 mg/dL (ref 8.9–10.3)
CO2: 24 mmol/L (ref 22–32)
CREATININE: 0.63 mg/dL (ref 0.44–1.00)
Chloride: 108 mmol/L (ref 101–111)
Glucose, Bld: 189 mg/dL — ABNORMAL HIGH (ref 65–99)
Potassium: 3.4 mmol/L — ABNORMAL LOW (ref 3.5–5.1)
SODIUM: 140 mmol/L (ref 135–145)
TOTAL PROTEIN: 7.6 g/dL (ref 6.5–8.1)
Total Bilirubin: 0.6 mg/dL (ref 0.3–1.2)

## 2016-02-03 LAB — URINE MICROSCOPIC-ADD ON

## 2016-02-03 LAB — URINALYSIS, ROUTINE W REFLEX MICROSCOPIC
Bilirubin Urine: NEGATIVE
Glucose, UA: 500 mg/dL — AB
Ketones, ur: NEGATIVE mg/dL
NITRITE: NEGATIVE
PROTEIN: 100 mg/dL — AB
SPECIFIC GRAVITY, URINE: 1.026 (ref 1.005–1.030)
pH: 8.5 — ABNORMAL HIGH (ref 5.0–8.0)

## 2016-02-03 LAB — CBC
HCT: 37.9 % (ref 36.0–46.0)
HEMOGLOBIN: 12.6 g/dL (ref 12.0–15.0)
MCH: 30.7 pg (ref 26.0–34.0)
MCHC: 33.2 g/dL (ref 30.0–36.0)
MCV: 92.2 fL (ref 78.0–100.0)
Platelets: 405 10*3/uL — ABNORMAL HIGH (ref 150–400)
RBC: 4.11 MIL/uL (ref 3.87–5.11)
RDW: 13.3 % (ref 11.5–15.5)
WBC: 5.7 10*3/uL (ref 4.0–10.5)

## 2016-02-03 LAB — WET PREP, GENITAL
Clue Cells Wet Prep HPF POC: NONE SEEN
SPERM: NONE SEEN
TRICH WET PREP: NONE SEEN
YEAST WET PREP: NONE SEEN

## 2016-02-03 LAB — LIPASE, BLOOD: Lipase: 16 U/L (ref 11–51)

## 2016-02-03 LAB — PREGNANCY, URINE: Preg Test, Ur: NEGATIVE

## 2016-02-03 MED ORDER — PHENAZOPYRIDINE HCL 200 MG PO TABS: 200.0000 mg | ORAL_TABLET | Freq: Three times a day (TID) | ORAL | Status: DC

## 2016-02-03 MED ORDER — CEPHALEXIN 500 MG PO CAPS
500.0000 mg | ORAL_CAPSULE | Freq: Once | ORAL | Status: AC
  Administered 2016-02-03: 500 mg via ORAL
  Filled 2016-02-03: qty 1

## 2016-02-03 MED ORDER — CEPHALEXIN 500 MG PO CAPS: 500.0000 mg | ORAL_CAPSULE | Freq: Two times a day (BID) | ORAL | Status: DC

## 2016-02-03 NOTE — ED Notes (Signed)
Patient presents from home via EMS for abdominal pain. Denies N/V/D, no vaginal discharge.   Last VS: 116/70, 90hr, 18resp.

## 2016-02-03 NOTE — Discharge Instructions (Signed)

## 2016-02-03 NOTE — ED Notes (Signed)
Bed: [facility] Expected date:  Expected time:  Means of arrival:  Comments: EMS - SI/Syncopal with AMS

## 2016-02-03 NOTE — ED Notes (Signed)
Patient c/o lower abdominal pain, dysuria x2 days. Denies fever, N/V/D, last BM two days ago, denies vaginal discharge.

## 2016-02-03 NOTE — ED Notes (Signed)
Pelvic cart to bedside 

## 2016-02-03 NOTE — ED Provider Notes (Signed)
CSN: [id number]     Arrival date & time 02/03/16  1605 History   First MD Initiated Contact with Patient 02/03/16 1639     Chief Complaint  Patient presents with  . Abdominal Pain   HPI  [name] is a 25 year old female with past medical history of diabetes presenting with abdominal pain and dysuria. Onset of symptoms was yesterday. Patient reports bilateral lower and suprapubic abdominal pain that is only present with ambulating and urinating. Resting completely alleviates the pain. She endorses associated dysuria. Denies vaginal discharge but states that she had blood on the paper when she wiped after urinating today. She is unsure if this came from her urine or vagina. She states she is not due for her period for 2 more weeks. Denies concern for STD exposure or recent unprotected intercourse. Patient was hospitalized 3 days ago for DKA after losing her insulin. Patient states her blood sugar has been controlled in the 100s since discharge. Denies fevers, chills, dizziness, syncope, flank pain, constipation, diarrhea, frank hematuria or change in frequency of urination.   Past Medical History  Diagnosis Date  . Diabetes mellitus   . Vaginal cysts    Past Surgical History  Procedure Laterality Date  . Cesarean section    . Hip surgery      r/hip  . Eye surgery     Family History  Problem Relation Age of Onset  . Cancer Mother   . Hypertension Father   . Thyroid disease Maternal Grandmother    Social History  Substance Use Topics  . Smoking status: Never Smoker   . Smokeless tobacco: None  . Alcohol Use: No   OB History    No data available     Review of Systems  All other systems reviewed and are negative.     Allergies  Bactrim  Home Medications   Prior to Admission medications   Medication Sig Start Date End Date Taking? Authorizing Provider  insulin aspart (NOVOLOG FLEXPEN) 100 UNIT/ML FlexPen Inject 20-45 Units into the skin 2 (two) times daily. Take 20  units in the am and Take 45 units at bedtime.   Yes Historical Provider, MD  insulin aspart (NOVOLOG) 100 UNIT/ML injection Inject 20-45 Units into the skin 2 (two) times daily.   Yes Historical Provider, MD  cephALEXin (KEFLEX) 500 MG capsule Take 1 capsule (500 mg total) by mouth 4 (four) times daily. Patient not taking: Reported on 01/30/2016 05/24/15   Mady GemmaElizabeth C Westfall, PA-C  cephALEXin (KEFLEX) 500 MG capsule Take 1 capsule (500 mg total) by mouth 2 (two) times daily. 02/03/16   Sahib Pella, PA-C  ibuprofen (ADVIL,MOTRIN) 800 MG tablet Take 1 tablet (800 mg total) by mouth 3 (three) times daily. Patient not taking: Reported on 01/30/2016 05/24/15   Mady GemmaElizabeth C Westfall, PA-C  morphine (MSIR) 15 MG tablet Take 1 tablet (15 mg total) by mouth every 6 (six) hours as needed for severe pain. Patient not taking: Reported on 02/03/2016 01/30/16   Lyndal Pulleyaniel Knott, MD  neomycin-polymyxin-dexameth (MAXITROL) 0.1 % OINT Place 1 application into the left eye 2 (two) times daily. Patient not taking: Reported on 02/03/2016 01/30/16 02/09/16  Lyndal Pulleyaniel Knott, MD  phenazopyridine (PYRIDIUM) 200 MG tablet Take 1 tablet (200 mg total) by mouth 3 (three) times daily. 02/03/16   Siham Bucaro, PA-C   BP 112/76 mmHg  Pulse 76  Temp(Src) 98.9 F (37.2 C) (Oral)  Resp 18  SpO2 100%  LMP 01/17/2016 Physical Exam  Constitutional: She appears  well-developed and well-nourished. No distress.  Nontoxic-appearing  HENT:  Head: Normocephalic and atraumatic.  Sutures in place at left eyebrow with mild ecchymosis and swelling. No erythema or drainage. No signs of superficial skin infection.  Eyes: Conjunctivae are normal. Right eye exhibits no discharge. Left eye exhibits no discharge. No scleral icterus.  Neck: Normal range of motion.  Cardiovascular: Normal rate, regular rhythm and normal heart sounds.   Pulmonary/Chest: Effort normal and breath sounds normal. No respiratory distress.  Abdominal: Soft. Normal appearance  and bowel sounds are normal. There is tenderness in the suprapubic area. There is no rigidity, no rebound, no guarding and no CVA tenderness.  Genitourinary: There is no rash on the right labia. There is no rash on the left labia. Cervix exhibits no motion tenderness, no discharge and no friability. Right adnexum displays no tenderness and no fullness. Left adnexum displays no tenderness and no fullness. No bleeding in the vagina. Vaginal discharge found.  Exam chaperoned by med tech. White vaginal discharge noted. No blood. No CMT or adnexal tenderness.  Musculoskeletal: Normal range of motion.  Neurological: She is alert. Coordination normal.  Skin: Skin is warm and dry.  Psychiatric: She has a normal mood and affect. Her behavior is normal.  Nursing note and vitals reviewed.   ED Course  Procedures (including critical care time) Labs Review Labs Reviewed  WET PREP, GENITAL - Abnormal; Notable for the following:    WBC, Wet Prep HPF POC MANY (*)    All other components within normal limits  COMPREHENSIVE METABOLIC PANEL - Abnormal; Notable for the following:    Potassium 3.4 (*)    Glucose, Bld 189 (*)    BUN 5 (*)    All other components within normal limits  CBC - Abnormal; Notable for the following:    Platelets 405 (*)    All other components within normal limits  URINALYSIS, ROUTINE W REFLEX MICROSCOPIC (NOT AT [facility]) - Abnormal; Notable for the following:    APPearance TURBID (*)    pH 8.5 (*)    Glucose, UA 500 (*)    Hgb urine dipstick LARGE (*)    Protein, ur 100 (*)    Leukocytes, UA LARGE (*)    All other components within normal limits  URINE MICROSCOPIC-ADD ON - Abnormal; Notable for the following:    Squamous Epithelial / LPF TOO NUMEROUS TO COUNT (*)    Bacteria, UA MANY (*)    All other components within normal limits  LIPASE, BLOOD  PREGNANCY, URINE  GC/CHLAMYDIA PROBE AMP ([place]) NOT AT [facility]    Imaging Review No results found. I have personally  reviewed and evaluated these images and lab results as part of my medical decision-making.   EKG Interpretation None      MDM   Final diagnoses:  UTI (lower urinary tract infection)   Pt presenting with dysuria and suprapubic discomfort. VSS. Abdomen is soft with suprapubic tenderness. No CVA tenderness or peritoneal signs. UA positive for TNTC leukocytes and many bacteria. Remaining blood work is reassuring. Glucose 189. No anion gap. Wet prep with WBC but no other positive findings. Pt has been diagnosed with a UTI. Will discharge with keflex and instructions to follow up with PCP if symptoms persist. Return precautions given in discharge paperwork and discussed with pt at bedside. Pt stable for discharge     Alveta Heimlich, PA-C 02/03/16 1934  Lavera Guise, MD 02/04/16 [phone]

## 2016-02-04 LAB — GC/CHLAMYDIA PROBE AMP (~~LOC~~) NOT AT ARMC
CHLAMYDIA, DNA PROBE: NEGATIVE
Neisseria Gonorrhea: NEGATIVE

## 2016-02-04 NOTE — Discharge Summary (Deleted)
  The note originally documented on this encounter has been moved the the encounter in which it belongs.  

## 2016-02-04 NOTE — Discharge Summary (Signed)
  The note originally documented on this encounter has been moved the the encounter in which it belongs.  

## 2018-09-04 ENCOUNTER — Encounter (INDEPENDENT_AMBULATORY_CARE_PROVIDER_SITE_OTHER): Payer: Medicaid Other | Admitting: Ophthalmology

## 2018-11-16 DIAGNOSIS — E10319 Type 1 diabetes mellitus with unspecified diabetic retinopathy without macular edema: Secondary | ICD-10-CM | POA: Insufficient documentation

## 2018-11-16 DIAGNOSIS — E1065 Type 1 diabetes mellitus with hyperglycemia: Secondary | ICD-10-CM | POA: Insufficient documentation

## 2018-11-16 DIAGNOSIS — Z970 Presence of artificial eye: Secondary | ICD-10-CM | POA: Insufficient documentation

## 2020-08-15 ENCOUNTER — Telehealth: Payer: Self-pay

## 2020-08-15 NOTE — Telephone Encounter (Signed)
Copied from CRM [id number]. Topic: General - Other >> Aug 15, 2020  9:28 AM Jaquita Rector A wrote: Reason for CRM: Patient is looking for a PCP and would like a call back if she can get an appointment to be seen here. Can be reached at Ph# [phone]) [phone]

## 2020-08-15 NOTE — Telephone Encounter (Signed)
appt 10/15/20 10 am

## 2020-08-26 ENCOUNTER — Emergency Department (HOSPITAL_COMMUNITY)
Admission: EM | Admit: 2020-08-26 | Discharge: 2020-08-26 | Disposition: A | Discharge status: Home / Self Care | Payer: Medicaid Other | Attending: Emergency Medicine | Admitting: Emergency Medicine

## 2020-08-26 ENCOUNTER — Encounter (HOSPITAL_COMMUNITY): Payer: Self-pay

## 2020-08-26 ENCOUNTER — Other Ambulatory Visit: Payer: Self-pay

## 2020-08-26 DIAGNOSIS — Z794 Long term (current) use of insulin: Secondary | ICD-10-CM | POA: Insufficient documentation

## 2020-08-26 DIAGNOSIS — Z202 Contact with and (suspected) exposure to infections with a predominantly sexual mode of transmission: Secondary | ICD-10-CM | POA: Diagnosis present

## 2020-08-26 DIAGNOSIS — E119 Type 2 diabetes mellitus without complications: Secondary | ICD-10-CM | POA: Insufficient documentation

## 2020-08-26 LAB — PREGNANCY, URINE: Preg Test, Ur: NEGATIVE

## 2020-08-26 MED ORDER — CEFTRIAXONE SODIUM 1 G IJ SOLR
500.0000 mg | Freq: Once | INTRAMUSCULAR | Status: AC
  Administered 2020-08-26: 500 mg via INTRAMUSCULAR
  Filled 2020-08-26: qty 10

## 2020-08-26 MED ORDER — DOXYCYCLINE HYCLATE 100 MG PO TABS
100.0000 mg | ORAL_TABLET | Freq: Once | ORAL | Status: AC
  Administered 2020-08-26: 100 mg via ORAL
  Filled 2020-08-26: qty 1

## 2020-08-26 MED ORDER — LIDOCAINE HCL 1 % IJ SOLN
INTRAMUSCULAR | Status: AC
  Filled 2020-08-26: qty 20

## 2020-08-26 MED ORDER — DOXYCYCLINE HYCLATE 100 MG PO CAPS
100.0000 mg | ORAL_CAPSULE | Freq: Two times a day (BID) | ORAL | 0 refills | Status: AC
Start: 2020-08-26 — End: 2020-09-02

## 2020-08-26 NOTE — Discharge Instructions (Addendum)
We will contact you with the results of your gonorrhea and Chlamydia testing if it is positive. Regardless since you had a positive test a few months ago, will treat you for both. Take the doxycycline as prescribed. Return to the ER if you start to experience abdominal pain, pelvic pain, abnormal bleeding, shortness of breath.

## 2020-08-26 NOTE — ED Notes (Signed)
Pt discharged from this ED in stable condition at this time. All discharge instructions and follow up care reviewed with pt with no further questions at this time. Pt ambulatory with steady gait, clear speech.  

## 2020-08-26 NOTE — ED Triage Notes (Signed)
Patient states she was exposed to gonorrhea and chlamydia. Patient denies any vaginal discharge, dysuria.

## 2020-08-26 NOTE — ED Provider Notes (Signed)
[facility]-EMERGENCY DEPT Provider Note   CSN: [id number] Arrival date & time: 08/26/20  2353     History Chief Complaint  Patient presents with  . Exposure to STD    Lisa [place] is a 30 y.o. female with a past medical history of diabetes presenting to the ED with a chief complaint of STD exposure.  States that she was tested in November 2021 and was unable to follow-up with results until today.  She was called by the health department in [place] and was told she was positive for gonorrhea and chlamydia.  She has not sought treatment since then.  She denies any vaginal discharge, abnormal vaginal bleeding, dysuria, abdominal pain or pelvic pain.  She denies possibility of pregnancy.  HPI     Past Medical History:  Diagnosis Date  . Diabetes mellitus   . Vaginal cysts     Patient Active Problem List   Diagnosis Date Noted  . DKA (diabetic ketoacidosis) (HCC) 01/31/2016  . Type 1 diabetes mellitus (HCC) 01/31/2016    Past Surgical History:  Procedure Laterality Date  . CESAREAN SECTION    . EYE SURGERY    . HIP SURGERY     r/hip     OB History   No obstetric history on file.     Family History  Problem Relation Age of Onset  . Cancer Mother   . Hypertension Father   . Thyroid disease Maternal Grandmother     Social History   Tobacco Use  . Smoking status: Never Smoker  . Smokeless tobacco: Never Used  Vaping Use  . Vaping Use: Never used  Substance Use Topics  . Alcohol use: No    Alcohol/week: 0.0 standard drinks  . Drug use: No    Home Medications Prior to Admission medications   Medication Sig Start Date End Date Taking? Authorizing Provider  doxycycline (VIBRAMYCIN) 100 MG capsule Take 1 capsule (100 mg total) by mouth 2 (two) times daily for 7 days. 08/26/20 09/02/20 Yes Ares Tegtmeyer, PA-C  ibuprofen (ADVIL,MOTRIN) 800 MG tablet Take 1 tablet (800 mg total) by mouth 3 (three) times daily. Patient not taking:  Reported on 01/30/2016 05/24/15   Mady Gemma, PA-C  insulin aspart (NOVOLOG FLEXPEN) 100 UNIT/ML FlexPen Inject 20-45 Units into the skin 2 (two) times daily. Take 20 units in the am and Take 45 units at bedtime.    [provider]  insulin aspart (NOVOLOG) 100 UNIT/ML injection Inject 20-45 Units into the skin 2 (two) times daily.    [provider]  morphine (MSIR) 15 MG tablet Take 1 tablet (15 mg total) by mouth every 6 (six) hours as needed for severe pain. Patient not taking: Reported on 02/03/2016 01/30/16   Lyndal Pulley, MD  phenazopyridine (PYRIDIUM) 200 MG tablet Take 1 tablet (200 mg total) by mouth 3 (three) times daily. 02/03/16   Barrett, Rolm Gala, PA-C    Allergies    Bactrim [sulfamethoxazole-trimethoprim]  Review of Systems   Review of Systems  Constitutional: Negative for fever.  Gastrointestinal: Negative for nausea.  Genitourinary: Negative for dysuria, vaginal bleeding and vaginal discharge.    Physical Exam Updated Vital Signs BP 108/72 (BP Location: Right Arm)   Pulse 80   Temp 98.1 F (36.7 C) (Oral)   Resp 14   Ht 5\' 5"  (1.651 m)   Wt 69.9 kg   LMP 07/31/2020   SpO2 100%   BMI 25.63 kg/m   Physical Exam Vitals and nursing  note reviewed.  Constitutional:      General: She is not in acute distress.    Appearance: She is well-developed and well-nourished. She is not diaphoretic.  HENT:     Head: Normocephalic and atraumatic.  Eyes:     General: No scleral icterus.    Extraocular Movements: EOM normal.     Conjunctiva/sclera: Conjunctivae normal.  Pulmonary:     Effort: Pulmonary effort is normal. No respiratory distress.  Musculoskeletal:     Cervical back: Normal range of motion.  Skin:    Findings: No rash.  Neurological:     Mental Status: She is alert.  Psychiatric:        Mood and Affect: Mood and affect normal.     ED Results / Procedures / Treatments   Labs (all labs ordered are listed, but only abnormal  results are displayed) Labs Reviewed  PREGNANCY, URINE  GC/CHLAMYDIA PROBE AMP ([place]) NOT AT [facility]    EKG None  Radiology No results found.  Procedures Procedures   Medications Ordered in ED Medications  cefTRIAXone (ROCEPHIN) injection 500 mg (has no administration in time range)  doxycycline (VIBRA-TABS) tablet 100 mg (has no administration in time range)    ED Course  I have reviewed the triage vital signs and the nursing notes.  Pertinent labs & imaging results that were available during my care of the patient were reviewed by me and considered in my medical decision making (see chart for details).  Clinical Course as of 08/26/20 1159  Tue Aug 26, 2020  1153 Preg Test, Ur: NEGATIVE [HK]    Clinical Course User Index [HK] Dietrich Pates, PA-C   MDM Rules/Calculators/A&P                          30 year old female presenting to the ED with a chief complaint of exposure to STD.  Patient was tested for STDs in November 2021 but unfortunately was unable to get the results until today.  She was called by the health department in [place] and was told she was positive for both gonorrhea and chlamydia.  She denies any symptoms, denies any discharge, dysuria, abnormal bleeding, abdominal pain or pelvic pain.  She is requesting treatment today.  She is declining further evaluation or work-up as I had offered her a pelvic exam and further testing.  Will obtain a urine GC chlamydia screen as well as urine pregnancy test prior to treatment.  Urine pregnancy test is negative.  Will treat with Rocephin and doxycycline and have her follow-up with results of gonorrhea chlamydia testing when available.   Patient is hemodynamically stable, in NAD, and able to ambulate in the ED. Evaluation does not show pathology that would require ongoing emergent intervention or inpatient treatment. I explained the diagnosis to the patient. Pain has been managed and has no complaints prior to  discharge. Patient is comfortable with above plan and is stable for discharge at this time. All questions were answered prior to disposition. Strict return precautions for returning to the ED were discussed. Encouraged follow up with PCP.   An After Visit Summary was printed and given to the patient.   Portions of this note were generated with Scientist, clinical (histocompatibility and immunogenetics). Dictation errors may occur despite best attempts at proofreading.  Final Clinical Impression(s) / ED Diagnoses Final diagnoses:  Exposure to STD    Rx / DC Orders ED Discharge Orders         Ordered  doxycycline (VIBRAMYCIN) 100 MG capsule  2 times daily        08/26/20 1156           Dietrich Pates, PA-C 08/26/20 1159    Lorre Nick, MD 08/27/20 1340

## 2020-09-26 ENCOUNTER — Emergency Department (HOSPITAL_COMMUNITY)
Admission: EM | Admit: 2020-09-26 | Discharge: 2020-09-26 | Disposition: A | Discharge status: Home / Self Care | Payer: Medicaid Other | Attending: Emergency Medicine | Admitting: Emergency Medicine

## 2020-09-26 ENCOUNTER — Encounter (HOSPITAL_COMMUNITY): Payer: Self-pay | Admitting: Emergency Medicine

## 2020-09-26 ENCOUNTER — Other Ambulatory Visit: Payer: Self-pay

## 2020-09-26 DIAGNOSIS — Y92410 Unspecified street and highway as the place of occurrence of the external cause: Secondary | ICD-10-CM | POA: Diagnosis not present

## 2020-09-26 DIAGNOSIS — Z794 Long term (current) use of insulin: Secondary | ICD-10-CM | POA: Insufficient documentation

## 2020-09-26 DIAGNOSIS — S39012A Strain of muscle, fascia and tendon of lower back, initial encounter: Secondary | ICD-10-CM | POA: Diagnosis not present

## 2020-09-26 DIAGNOSIS — S3992XA Unspecified injury of lower back, initial encounter: Secondary | ICD-10-CM | POA: Diagnosis present

## 2020-09-26 DIAGNOSIS — E109 Type 1 diabetes mellitus without complications: Secondary | ICD-10-CM | POA: Diagnosis not present

## 2020-09-26 MED ORDER — IBUPROFEN 200 MG PO TABS
600.0000 mg | ORAL_TABLET | Freq: Once | ORAL | Status: AC
  Administered 2020-09-26: 600 mg via ORAL
  Filled 2020-09-26: qty 3
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
## 2020-09-27 ENCOUNTER — Emergency Department (HOSPITAL_COMMUNITY)
Admission: EM | Admit: 2020-09-27 | Discharge: 2020-09-27 | Disposition: A | Discharge status: Home / Self Care | Payer: Medicaid Other | Attending: Emergency Medicine | Admitting: Emergency Medicine

## 2020-09-27 ENCOUNTER — Encounter (HOSPITAL_COMMUNITY): Payer: Self-pay | Admitting: Emergency Medicine

## 2020-09-27 ENCOUNTER — Other Ambulatory Visit: Payer: Self-pay

## 2020-09-27 DIAGNOSIS — E1010 Type 1 diabetes mellitus with ketoacidosis without coma: Secondary | ICD-10-CM | POA: Diagnosis not present

## 2020-09-27 DIAGNOSIS — M546 Pain in thoracic spine: Secondary | ICD-10-CM | POA: Diagnosis not present

## 2020-09-27 DIAGNOSIS — Z794 Long term (current) use of insulin: Secondary | ICD-10-CM | POA: Insufficient documentation

## 2020-09-27 DIAGNOSIS — M542 Cervicalgia: Secondary | ICD-10-CM | POA: Diagnosis not present

## 2020-09-27 DIAGNOSIS — M25512 Pain in left shoulder: Secondary | ICD-10-CM

## 2020-09-27 DIAGNOSIS — M25511 Pain in right shoulder: Secondary | ICD-10-CM | POA: Diagnosis not present

## 2020-09-27 DIAGNOSIS — Y92410 Unspecified street and highway as the place of occurrence of the external cause: Secondary | ICD-10-CM | POA: Insufficient documentation

## 2020-09-27 MED ORDER — DICLOFENAC SODIUM 1 % EX GEL
2.0000 g | Freq: Four times a day (QID) | CUTANEOUS | 0 refills | Status: DC
Start: 2020-09-27 — End: 2020-09-27

## 2020-09-27 MED ORDER — DICLOFENAC SODIUM 1 % EX GEL: 2.0000 g | Freq: Four times a day (QID) | CUTANEOUS | 0 refills | Status: DC
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
## 2020-10-04 ENCOUNTER — Emergency Department (HOSPITAL_COMMUNITY)
Admission: EM | Admit: 2020-10-04 | Discharge: 2020-10-04 | Disposition: A | Discharge status: Home / Self Care | Payer: Medicaid Other | Attending: Emergency Medicine | Admitting: Emergency Medicine

## 2020-10-04 ENCOUNTER — Other Ambulatory Visit: Payer: Self-pay

## 2020-10-04 ENCOUNTER — Encounter (HOSPITAL_COMMUNITY): Payer: Self-pay

## 2020-10-04 DIAGNOSIS — E109 Type 1 diabetes mellitus without complications: Secondary | ICD-10-CM | POA: Insufficient documentation

## 2020-10-04 DIAGNOSIS — R300 Dysuria: Secondary | ICD-10-CM | POA: Diagnosis present

## 2020-10-04 DIAGNOSIS — B9689 Other specified bacterial agents as the cause of diseases classified elsewhere: Secondary | ICD-10-CM

## 2020-10-04 DIAGNOSIS — N760 Acute vaginitis: Secondary | ICD-10-CM | POA: Insufficient documentation

## 2020-10-04 DIAGNOSIS — Z794 Long term (current) use of insulin: Secondary | ICD-10-CM | POA: Diagnosis not present

## 2020-10-04 DIAGNOSIS — N3001 Acute cystitis with hematuria: Secondary | ICD-10-CM | POA: Diagnosis not present

## 2020-10-04 LAB — URINALYSIS, ROUTINE W REFLEX MICROSCOPIC
Bilirubin Urine: NEGATIVE
Glucose, UA: >=500 mg/dL — AB
Ketones, ur: 80 mg/dL — AB
Nitrite: POSITIVE — AB
Protein, ur: 30 mg/dL — AB
Specific Gravity, Urine: 1.016 (ref 1.005–1.030)
WBC, UA: >50 WBC/hpf — ABNORMAL HIGH (ref 0–5)
pH: 6 (ref 5.0–8.0)

## 2020-10-04 LAB — WET PREP, GENITAL
Sperm: NONE SEEN
Trich, Wet Prep: NONE SEEN
WBC, Wet Prep HPF POC: NONE SEEN
Yeast Wet Prep HPF POC: NONE SEEN

## 2020-10-04 LAB — CBG MONITORING, ED: Glucose-Capillary: 267 mg/dL — ABNORMAL HIGH (ref 70–99)

## 2020-10-04 LAB — POC URINE PREG, ED: Preg Test, Ur: NEGATIVE

## 2020-10-04 MED ORDER — METRONIDAZOLE 500 MG PO TABS: 500.0000 mg | ORAL_TABLET | Freq: Two times a day (BID) | ORAL | 0 refills | Status: DC

## 2020-10-04 MED ORDER — CEPHALEXIN 500 MG PO CAPS
500.0000 mg | ORAL_CAPSULE | Freq: Two times a day (BID) | ORAL | 0 refills | Status: AC
Start: 2020-10-04 — End: 2020-10-09

## 2020-10-04 NOTE — ED Triage Notes (Signed)
Pt presents with c/o painful urination and possible exposure to STD. Pt reports she did have unprotected sex on Tuesday of this week and since then has been having some pain with urination and what she describes as "a lingering feeling in her uterus area". Pt denies any vaginal discharge.

## 2020-10-04 NOTE — Discharge Instructions (Signed)
Please pick up antibiotics and take as prescribed to cover for a urinary tract infection as well as bacterial vaginosis.   DO NOT DRINK ALCOHOL WHILE ON THIS MEDICATION AS IT CAN CAUSE AN ADVERSE REACTION.   We have tested you for gonorrhea and chlamydia. Please await your results - we will call you in 2-3 days time IF you test positive. You can also log into MyChart and check results that way. Do not have intercourse until you receive your results.   We will also call in 2-3 days time if the antibiotic for your UTI needs to be changed.   Please have your urine rechecked in 1 week to ensure that the infection has cleared.  You can follow up with your PCP or Center for [facility] for same.   Return to the ED for any worsening symptoms

## 2020-10-04 NOTE — ED Provider Notes (Signed)
[facility]-EMERGENCY DEPT Provider Note   CSN: [id number] Arrival date & time: 10/04/20  0913     History Chief Complaint  Patient presents with  . Exposure to STD  . Painful urination     Lisa Barnes is a 30 y.o. female with PMHx diabetes who presents to the ED today with complaint of possible exposure to STD vs UTI.  Patient reports she had unprotected intercourse with a female partner earlier this week.  She reports that since then she has noticed a burning sensation to her "uterus area."  Patient states that whenever she pees she has a slight burning however it lasts after urinating as well.  She denies any vaginal discharge.  She reports she has had a UTI in the past as well as chlamydia however cannot remember if the symptoms feel similar.  Last normal menstrual period 3/06.  Patient denies any fevers, chills, abdominal pain, nausea, vomiting, vaginal discharge, urinary frequency, urgency, difficulty urinating, pelvic pain, any other associated symptoms.    The history is provided by the patient and medical records.       Past Medical History:  Diagnosis Date  . Diabetes mellitus   . Vaginal cysts     Patient Active Problem List   Diagnosis Date Noted  . DKA (diabetic ketoacidosis) (HCC) 01/31/2016  . Type 1 diabetes mellitus (HCC) 01/31/2016    Past Surgical History:  Procedure Laterality Date  . CESAREAN SECTION    . EYE SURGERY    . HIP SURGERY     r/hip     OB History   No obstetric history on file.     Family History  Problem Relation Age of Onset  . Cancer Mother   . Hypertension Father   . Thyroid disease Maternal Grandmother     Social History   Tobacco Use  . Smoking status: Never Smoker  . Smokeless tobacco: Never Used  Vaping Use  . Vaping Use: Never used  Substance Use Topics  . Alcohol use: No    Alcohol/week: 0.0 standard drinks  . Drug use: No    Home Medications Prior to Admission medications    Medication Sig Start Date End Date Taking? Authorizing Provider  cephALEXin (KEFLEX) 500 MG capsule Take 1 capsule (500 mg total) by mouth 2 (two) times daily for 5 days. 10/04/20 10/09/20 Yes Riham Polyakov, PA-C  metroNIDAZOLE (FLAGYL) 500 MG tablet Take 1 tablet (500 mg total) by mouth 2 (two) times daily. 10/04/20  Yes Florabel Faulks, PA-C  diclofenac Sodium (VOLTAREN) 1 % GEL Apply 2 g topically 4 (four) times daily. 09/27/20   Gerhard Munch, MD  ibuprofen (ADVIL,MOTRIN) 800 MG tablet Take 1 tablet (800 mg total) by mouth 3 (three) times daily. Patient not taking: Reported on 01/30/2016 05/24/15   Mady Gemma, PA-C  insulin aspart (NOVOLOG) 100 UNIT/ML FlexPen Inject 20-45 Units into the skin 2 (two) times daily. Take 20 units in the am and Take 45 units at bedtime.    [provider]  insulin aspart (NOVOLOG) 100 UNIT/ML injection Inject 20-45 Units into the skin 2 (two) times daily.    [provider]  morphine (MSIR) 15 MG tablet Take 1 tablet (15 mg total) by mouth every 6 (six) hours as needed for severe pain. Patient not taking: Reported on 02/03/2016 01/30/16   Lyndal Pulley, MD  phenazopyridine (PYRIDIUM) 200 MG tablet Take 1 tablet (200 mg total) by mouth 3 (three) times daily. 02/03/16   Barrett,  Rolm Gala, PA-C    Allergies    Bactrim [sulfamethoxazole-trimethoprim]  Review of Systems   Review of Systems  Constitutional: Negative for chills and fever.  Gastrointestinal: Negative for abdominal pain, nausea and vomiting.  Genitourinary: Positive for dysuria and vaginal pain (burning sensation). Negative for menstrual problem, pelvic pain and vaginal discharge.  All other systems reviewed and are negative.   Physical Exam Updated Vital Signs BP 121/81 (BP Location: Right Arm)   Pulse 86   Temp 98.2 F (36.8 C) (Oral)   Resp 17   LMP 09/21/2020 (Approximate)   SpO2 97%   Physical Exam Vitals and nursing note reviewed.  Constitutional:       Appearance: She is not ill-appearing or diaphoretic.  HENT:     Head: Normocephalic and atraumatic.  Eyes:     Conjunctiva/sclera: Conjunctivae normal.  Cardiovascular:     Rate and Rhythm: Normal rate and regular rhythm.     Pulses: Normal pulses.  Pulmonary:     Effort: Pulmonary effort is normal.     Breath sounds: Normal breath sounds. No wheezing, rhonchi or rales.  Abdominal:     Palpations: Abdomen is soft.     Tenderness: There is no abdominal tenderness. There is no guarding or rebound.  Genitourinary:    Comments: Chaperone present for exam. No rashes, lesions, or tenderness to external genitalia. No erythema, injury, or tenderness to vaginal mucosa. Thin white vaginal discharge in vault. No adnexal masses, tenderness, or fullness. No CMT, cervical friability, or discharge from cervical os. Cervical os is closed. Uterus non-deviated, mobile, nonTTP, and without enlargement.  Musculoskeletal:     Cervical back: Neck supple.  Skin:    General: Skin is warm and dry.  Neurological:     Mental Status: She is alert.     ED Results / Procedures / Treatments   Labs (all labs ordered are listed, but only abnormal results are displayed) Labs Reviewed  WET PREP, GENITAL - Abnormal; Notable for the following components:      Result Value   Clue Cells Wet Prep HPF POC PRESENT (*)    All other components within normal limits  URINALYSIS, ROUTINE W REFLEX MICROSCOPIC - Abnormal; Notable for the following components:   APPearance CLOUDY (*)    Glucose, UA >=500 (*)    Hgb urine dipstick MODERATE (*)    Ketones, ur 80 (*)    Protein, ur 30 (*)    Nitrite POSITIVE (*)    Leukocytes,Ua LARGE (*)    WBC, UA >50 (*)    Bacteria, UA MANY (*)    Non Squamous Epithelial 6-10 (*)    All other components within normal limits  CBG MONITORING, ED - Abnormal; Notable for the following components:   Glucose-Capillary 267 (*)    All other components within normal limits  URINE CULTURE   POC URINE PREG, ED  GC/CHLAMYDIA PROBE AMP ([place]) NOT AT [facility]    EKG None  Radiology No results found.  Procedures Procedures   Medications Ordered in ED Medications - No data to display  ED Course  I have reviewed the triage vital signs and the nursing notes.  Pertinent labs & imaging results that were available during my care of the patient were reviewed by me and considered in my medical decision making (see chart for details).    MDM Rules/Calculators/A&P  30 year old female presents to the ED today complaining of a burning sensation to her vaginal area as well as dysuria for the past couple days secondary to having unprotected intercourse earlier this week.  Arrival to the ED vitals are stable.  Patient appears to be in no acute distress.  She is texting throughout my exam.  She does not want HIV or syphilis testing at this time as she reports that she just recently had a child and was tested for same.  When instructed that she could have these given she just recently had unprotected intercourse she again denies being tested.  We will plan for pelvic exam, urinalysis, urine pregnancy test.  Patient is also a type I diabetic, will obtain CBG at this time.   CBG 267 U/A with moderate hgb, 21-50 RBCs per HPF, positive nitrites, and large leuks with > 50 WBCs. Will treat for UTI at this time. Urine culture sent.  Urine preg negative  Pelvic exam performed - thin white vaginal discharge in vault. No adnexal or CMT. Wet prep has returned positive for clue cells.  Will treat for UTI and BV at this time. Pt instructed to await for STD results and to not have sex until then. She is in agreement with plan and stable for discharge.   This note was prepared using Dragon voice recognition software and may include unintentional dictation errors due to the inherent limitations of voice recognition software.  Final Clinical Impression(s) / ED Diagnoses Final  diagnoses:  Acute cystitis with hematuria  Bacterial vaginosis    Rx / DC Orders ED Discharge Orders         Ordered    cephALEXin (KEFLEX) 500 MG capsule  2 times daily        10/04/20 1128    metroNIDAZOLE (FLAGYL) 500 MG tablet  2 times daily        10/04/20 1128           Discharge Instructions     Please pick up antibiotics and take as prescribed to cover for a urinary tract infection as well as bacterial vaginosis.   DO NOT DRINK ALCOHOL WHILE ON THIS MEDICATION AS IT CAN CAUSE AN ADVERSE REACTION.   We have tested you for gonorrhea and chlamydia. Please await your results - we will call you in 2-3 days time IF you test positive. You can also log into MyChart and check results that way. Do not have intercourse until you receive your results.   We will also call in 2-3 days time if the antibiotic for your UTI needs to be changed.   Please have your urine rechecked in 1 week to ensure that the infection has cleared.  You can follow up with your PCP or [facility] for [facility] for same.   Return to the ED for any worsening symptoms       Tanda Rockers, Cordelia Poche 10/04/20 1129    Koleen Distance, MD 10/04/20 1430

## 2020-10-04 NOTE — ED Notes (Signed)
Unable to get a complete set of vitals or go over all the discharge instruction with the patient before she had to leave.

## 2020-10-05 LAB — URINE CULTURE: Culture: >=100000 — AB

## 2020-10-06 LAB — GC/CHLAMYDIA PROBE AMP (~~LOC~~) NOT AT ARMC
Chlamydia: NEGATIVE
Comment: NEGATIVE
Comment: NORMAL
Neisseria Gonorrhea: NEGATIVE

## 2020-10-06 LAB — URINE CULTURE

## 2020-10-07 LAB — URINE CULTURE

## 2020-10-08 ENCOUNTER — Telehealth: Payer: Self-pay | Admitting: Emergency Medicine

## 2020-10-08 NOTE — Telephone Encounter (Signed)
Post ED Visit - Positive Culture Follow-up  Culture report reviewed by antimicrobial stewardship pharmacist: [facility] []  , Pharm.D. []  Enzo Bi, Pharm.D., BCPS AQ-ID []  , Pharm.D., BCPS []  Celedonio Miyamoto, Pharm.D., BCPS []  [place], Garvin Fila.D., BCPS, AAHIVP []  , Pharm.D., BCPS, AAHIVP []  Georgina Pillion, PharmD, BCPS []  , PharmD, BCPS []  [place], PharmD, BCPS []  [address], PharmD []  , PharmD, BCPS []  Estella Husk, PharmD  Pharmacy Team []  Lysle Pearl, PharmD []  , PharmD []  Phillips Climes, PharmD []  , Rph []  Agapito Games) , PharmD []  Verlan Friends, PharmD []  , PharmD []  Mervyn Gay, PharmD []  , PharmD []  Vinnie Level, PharmD []  [facility], PharmD []  , PharmD []  Len Childs, PharmD   Positive urine culture Treated with cephalexin, organism sensitive to the same and no further patient follow-up is required at this time.  10/08/2020, 10:33 AM

## 2020-10-15 ENCOUNTER — Ambulatory Visit: Payer: Self-pay | Admitting: Physician Assistant

## 2020-11-03 ENCOUNTER — Encounter: Payer: Self-pay | Admitting: Occupational Therapy

## 2020-11-03 ENCOUNTER — Ambulatory Visit: Payer: Medicaid Other | Attending: Internal Medicine | Admitting: Occupational Therapy

## 2020-11-03 ENCOUNTER — Other Ambulatory Visit: Payer: Self-pay

## 2020-11-03 DIAGNOSIS — M25511 Pain in right shoulder: Secondary | ICD-10-CM

## 2020-11-03 DIAGNOSIS — M542 Cervicalgia: Secondary | ICD-10-CM

## 2020-11-03 DIAGNOSIS — R208 Other disturbances of skin sensation: Secondary | ICD-10-CM | POA: Diagnosis present
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
## 2020-11-07 ENCOUNTER — Other Ambulatory Visit (HOSPITAL_COMMUNITY): Payer: Self-pay

## 2020-11-10 ENCOUNTER — Ambulatory Visit: Payer: Medicaid Other | Admitting: Occupational Therapy

## 2020-11-17 ENCOUNTER — Ambulatory Visit: Payer: No Typology Code available for payment source | Attending: Internal Medicine | Admitting: Occupational Therapy

## 2020-11-20 ENCOUNTER — Encounter (HOSPITAL_COMMUNITY): Payer: Self-pay

## 2020-11-20 ENCOUNTER — Ambulatory Visit (HOSPITAL_COMMUNITY)
Admission: EM | Admit: 2020-11-20 | Discharge: 2020-11-20 | Disposition: A | Discharge status: Home / Self Care | Payer: Medicaid Other | Attending: Emergency Medicine | Admitting: Emergency Medicine

## 2020-11-20 ENCOUNTER — Other Ambulatory Visit: Payer: Self-pay

## 2020-11-20 DIAGNOSIS — Z3201 Encounter for pregnancy test, result positive: Secondary | ICD-10-CM

## 2020-11-20 LAB — POC URINE PREG, ED: Preg Test, Ur: POSITIVE — AB

## 2020-11-20 NOTE — ED Triage Notes (Signed)
Pt is here today for a pregnancy test. She states she took a pregnancy test at home that showed positive. She states she has missed a period.

## 2020-11-20 NOTE — Discharge Instructions (Addendum)
Pregnancy test is positive today  Can contact the women's center to establish care or or find her own provider  You may start taking prenatal vitamins with folic acid

## 2020-11-20 NOTE — ED Provider Notes (Signed)
[facility]    CSN: [id number] Arrival date & time: 11/20/20  1538      History   Chief Complaint Chief Complaint  Patient presents with  . Possible Pregnancy    HPI Lisa Barnes is a 30 y.o. female.   Patient presents requesting pregnancy test after having abnormal menses last month.  Positive pregnancy test at home.  Last menstrual period was October 19, 2020 lasting only about 2 to 3 days.  Sexually active 1 partner no condom.  Endorses mild nausea and urinary frequency.  Denies discharge, itching, odor, abdominal pain, flank pain.  Past Medical History:  Diagnosis Date  . Diabetes mellitus   . Vaginal cysts     Patient Active Problem List   Diagnosis Date Noted  . DKA (diabetic ketoacidosis) (HCC) 01/31/2016  . Type 1 diabetes mellitus (HCC) 01/31/2016    Past Surgical History:  Procedure Laterality Date  . CESAREAN SECTION    . EYE SURGERY    . HIP SURGERY     r/hip    OB History   No obstetric history on file.      Home Medications    Prior to Admission medications   Medication Sig Start Date End Date Taking? Authorizing Provider  diclofenac Sodium (VOLTAREN) 1 % GEL Apply 2 g topically 4 (four) times daily. Patient not taking: No sig reported 09/27/20   Gerhard Munch, MD  ibuprofen (ADVIL,MOTRIN) 800 MG tablet Take 1 tablet (800 mg total) by mouth 3 (three) times daily. Patient not taking: No sig reported 05/24/15   Glean Hess C, PA-C  insulin aspart protamine - aspart (NOVOLOG MIX 70/30 FLEXPEN) (70-30) 100 UNIT/ML FlexPen Inject 40 Units into the skin in the morning and at bedtime. 01/13/18 10/15/20  [provider]  metroNIDAZOLE (FLAGYL) 500 MG tablet Take 1 tablet (500 mg total) by mouth 2 (two) times daily. 10/04/20   Tanda Rockers, PA-C  morphine (MSIR) 15 MG tablet Take 1 tablet (15 mg total) by mouth every 6 (six) hours as needed for severe pain. Patient not taking: No sig reported 01/30/16   Lyndal Pulley,  MD  phenazopyridine (PYRIDIUM) 200 MG tablet Take 1 tablet (200 mg total) by mouth 3 (three) times daily. Patient not taking: No sig reported 02/03/16   Barrett, Rolm Gala, PA-C    Family History Family History  Problem Relation Age of Onset  . Cancer Mother   . Hypertension Father   . Thyroid disease Maternal Grandmother     Social History Social History   Tobacco Use  . Smoking status: Never Smoker  . Smokeless tobacco: Never Used  Vaping Use  . Vaping Use: Never used  Substance Use Topics  . Alcohol use: No    Alcohol/week: 0.0 standard drinks  . Drug use: No     Allergies   Bactrim [sulfamethoxazole-trimethoprim]   Review of Systems Review of Systems  Defer to HPI   Physical Exam Triage Vital Signs ED Triage Vitals  Enc Vitals Group     BP 11/20/20 1548 110/81     Pulse Rate 11/20/20 1548 93     Resp 11/20/20 1548 18     Temp 11/20/20 1548 98.7 F (37.1 C)     Temp Source 11/20/20 1548 Oral     SpO2 11/20/20 1548 100 %     Weight --      Height --      Head Circumference --      Peak Flow --  Pain Score 11/20/20 1547 0     Pain Loc --      Pain Edu? --      Excl. in GC? --    No data found.  Updated Vital Signs BP 110/81 (BP Location: Left Arm)   Pulse 93   Temp 98.7 F (37.1 C) (Oral)   Resp 18   LMP 10/19/2020 (Exact Date)   SpO2 100%   Visual Acuity Right Eye Distance:   Left Eye Distance:   Bilateral Distance:    Right Eye Near:   Left Eye Near:    Bilateral Near:     Physical Exam Constitutional:      Appearance: Normal appearance. She is normal weight.  HENT:     Head: Normocephalic.  Eyes:     Extraocular Movements: Extraocular movements intact.  Pulmonary:     Effort: Pulmonary effort is normal.  Musculoskeletal:        General: Normal range of motion.  Skin:    General: Skin is warm and dry.  Neurological:     General: No focal deficit present.     Mental Status: She is alert and oriented to person, place, and  time. Mental status is at baseline.  Psychiatric:        Mood and Affect: Mood normal.        Behavior: Behavior normal.        Thought Content: Thought content normal.        Judgment: Judgment normal.      UC Treatments / Results  Labs (all labs ordered are listed, but only abnormal results are displayed) Labs Reviewed  POC URINE PREG, ED - Abnormal; Notable for the following components:      Result Value   Preg Test, Ur POSITIVE (*)    All other components within normal limits    EKG   Radiology No results found.  Procedures Procedures (including critical care time)  Medications Ordered in UC Medications - No data to display  Initial Impression / Assessment and Plan / UC Course  I have reviewed the triage vital signs and the nursing notes.  Pertinent labs & imaging results that were available during my care of the patient were reviewed by me and considered in my medical decision making (see chart for details).  Positive pregnancy test  1.  Urine pregnancy positive 2.  Given information for the women Center to establish care, patient stays she has history of gestational diabetes many attempt to establish care with [facility] 3.  Encourage patient to start taking prenatal vitamins with folic acid while waiting to establish care Final Clinical Impressions(s) / UC Diagnoses   Final diagnoses:  Positive pregnancy test     Discharge Instructions     Pregnancy test is positive today  Can contact the women's center to establish care or or find her own provider  You may start taking prenatal vitamins with folic acid      ED Prescriptions    None     PDMP not reviewed this encounter.   Valinda Hoar, NP 11/20/20 1606

## 2020-11-24 ENCOUNTER — Ambulatory Visit: Payer: No Typology Code available for payment source | Admitting: Occupational Therapy

## 2020-12-01 ENCOUNTER — Ambulatory Visit: Payer: No Typology Code available for payment source | Admitting: Occupational Therapy

## 2020-12-01 NOTE — Therapy (Signed)
[place] [address] [place], [place], [address] Phone: [phone]   Fax:  [phone]  Dec 01, 2020    No Recipients  Occupational Therapy Discharge Summary   Patient: Lisa Barnes MRN: [id number] Date of Birth: 1991-03-02  Diagnosis: Acute pain of right shoulder - Plan: Ot plan of care cert/re-cert  Other disturbances of skin sensation - Plan: Ot plan of care cert/re-cert  Cervicalgia - Plan: Ot plan of care cert/re-cert  Referring Provider (OT): Latanya Presser, MD  OCCUPATIONAL THERAPY DISCHARGE SUMMARY  Visits from Start of Care: 1  Current functional level related to goals / functional outcomes: Pt did not achieve goals as they did not return following eval.   Remaining deficits: See eval   Education / Equipment: Education was not completed as pt did not return.  Plan: Patient agrees to discharge.  Patient goals were not met. Patient is being discharged due to not returning since the last visit.  ?????        Sincerely,  Zachery Conch, OT  CC No Recipients  [facility] [address] [place], [place], [address] Phone: [phone]   Fax:  [phone]  Patient: Lisa Barnes MRN: [id number] Date of Birth: 1990/10/22

## 2020-12-09 ENCOUNTER — Inpatient Hospital Stay (HOSPITAL_COMMUNITY): Payer: Medicaid Other

## 2020-12-09 ENCOUNTER — Encounter (HOSPITAL_COMMUNITY): Payer: Self-pay

## 2020-12-09 ENCOUNTER — Other Ambulatory Visit: Payer: Self-pay

## 2020-12-09 ENCOUNTER — Inpatient Hospital Stay (HOSPITAL_COMMUNITY)
Admission: AD | Admit: 2020-12-09 | Discharge: 2020-12-09 | Disposition: A | Discharge status: Home / Self Care | Payer: Medicaid Other | Attending: Obstetrics & Gynecology | Admitting: Obstetrics & Gynecology

## 2020-12-09 DIAGNOSIS — O24011 Pre-existing diabetes mellitus, type 1, in pregnancy, first trimester: Secondary | ICD-10-CM

## 2020-12-09 DIAGNOSIS — R300 Dysuria: Secondary | ICD-10-CM | POA: Diagnosis present

## 2020-12-09 DIAGNOSIS — R109 Unspecified abdominal pain: Secondary | ICD-10-CM | POA: Insufficient documentation

## 2020-12-09 DIAGNOSIS — O99891 Other specified diseases and conditions complicating pregnancy: Secondary | ICD-10-CM | POA: Diagnosis not present

## 2020-12-09 DIAGNOSIS — R102 Pelvic and perineal pain: Secondary | ICD-10-CM | POA: Diagnosis not present

## 2020-12-09 DIAGNOSIS — Z794 Long term (current) use of insulin: Secondary | ICD-10-CM

## 2020-12-09 DIAGNOSIS — Z3A01 Less than 8 weeks gestation of pregnancy: Secondary | ICD-10-CM | POA: Diagnosis not present

## 2020-12-09 DIAGNOSIS — O26891 Other specified pregnancy related conditions, first trimester: Secondary | ICD-10-CM | POA: Insufficient documentation

## 2020-12-09 DIAGNOSIS — R252 Cramp and spasm: Secondary | ICD-10-CM | POA: Diagnosis not present

## 2020-12-09 DIAGNOSIS — O3680X0 Pregnancy with inconclusive fetal viability, not applicable or unspecified: Secondary | ICD-10-CM

## 2020-12-09 LAB — CBC
HCT: 34.9 % — ABNORMAL LOW (ref 36.0–46.0)
Hemoglobin: 11 g/dL — ABNORMAL LOW (ref 12.0–15.0)
MCH: 26.8 pg (ref 26.0–34.0)
MCHC: 31.5 g/dL (ref 30.0–36.0)
MCV: 84.9 fL (ref 80.0–100.0)
Platelets: 403 10*3/uL — ABNORMAL HIGH (ref 150–400)
RBC: 4.11 MIL/uL (ref 3.87–5.11)
RDW: 15.8 % — ABNORMAL HIGH (ref 11.5–15.5)
WBC: 6.9 10*3/uL (ref 4.0–10.5)
nRBC: 0 % (ref 0.0–0.2)

## 2020-12-09 LAB — URINALYSIS, ROUTINE W REFLEX MICROSCOPIC
Bacteria, UA: NONE SEEN
Bilirubin Urine: NEGATIVE
Glucose, UA: >=500 mg/dL — AB
Ketones, ur: 80 mg/dL — AB
Nitrite: NEGATIVE
Protein, ur: 100 mg/dL — AB
RBC / HPF: >50 RBC/hpf — ABNORMAL HIGH (ref 0–5)
Specific Gravity, Urine: 1.024 (ref 1.005–1.030)
WBC, UA: >50 WBC/hpf — ABNORMAL HIGH (ref 0–5)
pH: 5 (ref 5.0–8.0)

## 2020-12-09 LAB — WET PREP, GENITAL
Clue Cells Wet Prep HPF POC: NONE SEEN
Sperm: NONE SEEN
Trich, Wet Prep: NONE SEEN
Yeast Wet Prep HPF POC: NONE SEEN

## 2020-12-09 LAB — HCG, QUANTITATIVE, PREGNANCY: hCG, Beta Chain, Quant, S: 72056 m[IU]/mL — ABNORMAL HIGH (ref <5)

## 2020-12-09 LAB — ABO/RH: ABO/RH(D): O POS

## 2020-12-09 LAB — HIV ANTIBODY (ROUTINE TESTING W REFLEX): HIV Screen 4th Generation wRfx: NONREACTIVE

## 2020-12-09 NOTE — Discharge Instructions (Signed)
Dysuria Dysuria is pain or discomfort during urination. The pain or discomfort may be felt in the part of the body that drains urine from the bladder (urethra) or in the surrounding tissue of the genitals. The pain may also be felt in the groin area, lower abdomen, or lower back. You may have to urinate frequently or have the sudden feeling that you have to urinate (urgency). Dysuria can affect anyone, but it is more common in females. Dysuria can be caused by many different things, including:  Urinary tract infection.  Kidney stones or bladder stones.  Certain STIs (sexually transmitted infections), such as chlamydia.  Dehydration.  Inflammation of the tissues of the vagina.  Use of certain medicines.  Use of certain soaps or scented products that cause irritation. Follow these instructions at home: Medicines  Take over-the-counter and prescription medicines only as told by your health care provider.  If you were prescribed an antibiotic medicine, take it as told by your health care provider. Do not stop taking the antibiotic even if you start to feel better. Eating and drinking  Drink enough fluid to keep your urine pale yellow.  Avoid caffeinated beverages, tea, and alcohol. These beverages can irritate the bladder and make dysuria worse. In males, alcohol may irritate the prostate.   General instructions  Watch your condition for any changes.  Urinate often. Avoid holding urine for long periods of time.  If you are female, you should wipe from front to back after urinating or having a bowel movement. Use each piece of toilet paper only once.  Empty your bladder after sex.  Keep all follow-up visits. This is important.  If you had any tests done to find the cause of dysuria, it is up to you to get your test results. Ask your health care provider, or the department that is doing the test, when your results will be ready. Contact a health care provider if:  You have a  fever.  You develop pain in your back or sides.  You have nausea or vomiting.  You have blood in your urine.  You are not urinating as often as you usually do. Get help right away if:  Your pain is severe and not relieved with medicines.  You cannot eat or drink without vomiting.  You are confused.  You have a rapid heartbeat while resting.  You have shaking or chills.  You feel extremely weak. Summary  Dysuria is pain or discomfort while urinating. Many different conditions can lead to dysuria.  If you have dysuria, you may have to urinate frequently or have the sudden feeling that you have to urinate (urgency).  Watch your condition for any changes. Keep all follow-up visits.  Make sure that you urinate often and drink enough fluid to keep your urine pale yellow. This information is not intended to replace advice given to you by your health care provider. Make sure you discuss any questions you have with your health care provider. Document Revised: 02/15/2020 Document Reviewed: 02/15/2020 Elsevier Patient Education  2021 Elsevier Inc.  Obstetrics: Normal and Problem Pregnancies (7th ed., pp. 102-121). [place], [place]: Elsevier."> Textbook of Family Medicine (9th ed., pp. [phone]). [place], [place]: Elsevier [employer].">  First Trimester of Pregnancy  The first trimester of pregnancy starts on the first day of your last menstrual period until the end of week 12. This is months 1 through 3 of pregnancy. A week after a sperm fertilizes an egg, the egg will implant into the wall of  the uterus and begin to develop into a baby. By the end of 12 weeks, all the baby's organs will be formed and the baby will be 2-3 inches in size. Body changes during your first trimester Your body goes through many changes during pregnancy. The changes vary and generally return to normal after your baby is born. Physical changes  You may gain or lose weight.  Your breasts may begin to grow  larger and become tender. The tissue that surrounds your nipples (areola) may become darker.  Dark spots or blotches (chloasma or mask of pregnancy) may develop on your face.  You may have changes in your hair. These can include thickening or thinning of your hair or changes in texture. Health changes  You may feel nauseous, and you may vomit.  You may have heartburn.  You may develop headaches.  You may develop constipation.  Your gums may bleed and may be sensitive to brushing and flossing. Other changes  You may tire easily.  You may urinate more often.  Your menstrual periods will stop.  You may have a loss of appetite.  You may develop cravings for certain kinds of food.  You may have changes in your emotions from day to day.  You may have more vivid and strange dreams. Follow these instructions at home: Medicines  Follow your health care provider's instructions regarding medicine use. Specific medicines may be either safe or unsafe to take during pregnancy. Do not take any medicines unless told to by your health care provider.  Take a prenatal vitamin that contains at least 600 micrograms (mcg) of folic acid. Eating and drinking  Eat a healthy diet that includes fresh fruits and vegetables, whole grains, good sources of protein such as meat, eggs, or tofu, and low-fat dairy products.  Avoid raw meat and unpasteurized juice, milk, and cheese. These carry germs that can harm you and your baby.  If you feel nauseous or you vomit: ? Eat 4 or 5 small meals a day instead of 3 large meals. ? Try eating a few soda crackers. ? Drink liquids between meals instead of during meals.  You may need to take these actions to prevent or treat constipation: ? Drink enough fluid to keep your urine pale yellow. ? Eat foods that are high in fiber, such as beans, whole grains, and fresh fruits and vegetables. ? Limit foods that are high in fat and processed sugars, such as fried or  sweet foods. Activity  Exercise only as directed by your health care provider. Most people can continue their usual exercise routine during pregnancy. Try to exercise for 30 minutes at least 5 days a week.  Stop exercising if you develop pain or cramping in the lower abdomen or lower back.  Avoid exercising if it is very hot or humid or if you are at high altitude.  Avoid heavy lifting.  If you choose to, you may have sex unless your health care provider tells you not to. Relieving pain and discomfort  Wear a good support bra to relieve breast tenderness.  Rest with your legs elevated if you have leg cramps or low back pain.  If you develop bulging veins (varicose veins) in your legs: ? Wear support hose as told by your health care provider. ? Elevate your feet for 15 minutes, 3-4 times a day. ? Limit salt in your diet. Safety  Wear your seat belt at all times when driving or riding in a car.  Talk with  your health care provider if someone is verbally or physically abusive to you.  Talk with your health care provider if you are feeling sad or have thoughts of hurting yourself. Lifestyle  Do not use hot tubs, steam rooms, or saunas.  Do not douche. Do not use tampons or scented sanitary pads.  Do not use herbal remedies, alcohol, illegal drugs, or medicines that are not approved by your health care provider. Chemicals in these products can harm your baby.  Do not use any products that contain nicotine or tobacco, such as cigarettes, e-cigarettes, and chewing tobacco. If you need help quitting, ask your health care provider.  Avoid cat litter boxes and soil used by cats. These carry germs that can cause birth defects in the baby and possibly loss of the unborn baby (fetus) by miscarriage or stillbirth. General instructions  During routine prenatal visits in the first trimester, your health care provider will do a physical exam, perform necessary tests, and ask you how things  are going. Keep all follow-up visits. This is important.  Ask for help if you have counseling or nutritional needs during pregnancy. Your health care provider can offer advice or refer you to specialists for help with various needs.  Schedule a dentist appointment. At home, brush your teeth with a soft toothbrush. Floss gently.  Write down your questions. Take them to your prenatal visits. Where to find more information  American Pregnancy Association: americanpregnancy.org  [employer] of [employer]st[employer]ians and Gynecologists: https://www.todd-brady.net/  Office on [employer] Health: MightyReward.co.nz Contact a health care provider if you have:  Dizziness.  A fever.  Mild pelvic cramps, pelvic pressure, or nagging pain in the abdominal area.  Nausea, vomiting, or diarrhea that lasts for 24 hours or longer.  A bad-smelling vaginal discharge.  Pain when you urinate.  Known exposure to a contagious illness, such as chickenpox, measles, Zika virus, HIV, or hepatitis. Get help right away if you have:  Spotting or bleeding from your vagina.  Severe abdominal cramping or pain.  Shortness of breath or chest pain.  Any kind of trauma, such as from a fall or a car crash.  New or increased pain, swelling, or redness in an arm or leg. Summary  The first trimester of pregnancy starts on the first day of your last menstrual period until the end of week 12 (months 1 through 3).  Eating 4 or 5 small meals a day rather than 3 large meals may help to relieve nausea and vomiting.  Do not use any products that contain nicotine or tobacco, such as cigarettes, e-cigarettes, and chewing tobacco. If you need help quitting, ask your health care provider.  Keep all follow-up visits. This is important. This information is not intended to replace advice given to you by your health care provider. Make sure you discuss any questions you have with your health care  provider. Document Revised: 12/12/2019 Document Reviewed: 10/18/2019 Elsevier Patient Education  2021 Elsevier Inc.  [facility] [employer] [facility] [employer] [facility]      Phone: [phone]  [facility] [employer] at [place]    Phone: [phone]  [facility] [employer] at [place]   Phone: [phone]  [facility] [employer] at [facility]   Phone: [phone]  [facility] [employer] at [place]   Phone: [phone]  [facility] at [facility]    Phone: [phone]

## 2020-12-09 NOTE — ED Notes (Signed)
Report given to MAU 

## 2020-12-09 NOTE — MAU Note (Signed)
Pt reports burning with urination tonight, some vomiting tonight. Denies bleeding.

## 2020-12-09 NOTE — ED Provider Notes (Signed)
Emergency Medicine Provider OB Triage Evaluation Note  Lisa Barnes is a 30 y.o. female, currently [redacted] weeks pregnant who presents to the emergency department with complaints of.  Symptoms started she got up to use the bathroom and noted some burning with urination.  She reports when she laid back down patient developed generalized abdominal pain worse in the left lower quadrant.  Is scheduled to see me Far in her pregnancy.  Review of  Systems  Positive: Abdominal pain, dysuria Negative: Fever, vomiting, vaginal bleeding  Physical Exam  BP 108/73 (BP Location: Right Arm)   Pulse 87   Temp 97.9 F (36.6 C)   Resp 18   SpO2 99%  General: Awake, no distress  HEENT: Atraumatic  Resp: Normal effort  Cardiac: Normal rate Abd: Nondistended, slightly tenderness MSK: Moves all extremities without difficulty Neuro: Speech clear  Medical Decision Making  Pt evaluated for pregnancy concern and is stable for transfer to MAU. Pt is in agreement with plan for transfer.  3:39 AM Discussed with MAU APP, Hilda Lias, who accepts patient in transfer.  Clinical Impression  Abd pain in pregnancy     Legrand Rams 12/09/20 2426    Shon Baton, MD 12/09/20 [phone]

## 2020-12-09 NOTE — ED Triage Notes (Signed)
Pt began to have lower abd pain this even after getting up to go to the bathroom, denies vaginal bleeding or discharge, appx 7wk

## 2020-12-09 NOTE — MAU Provider Note (Signed)
Chief Complaint: No chief complaint on file.   Event Date/Time   First Provider Initiated Contact with Patient 12/09/20 0446        SUBJECTIVE HPI: Lexii Walsh is a 30 y.o. G1P0 at [redacted]w[redacted]d by LMP who presents to maternity admissions reporting dysuria.  Concerned she has an STD.  Has some cramping.  Has not arranged care yet.  [employer], though lives in [place]. Marland Kitchen She denies vaginal bleeding, vaginal itching/burning, h/a, dizziness, n/v, or fever/chills.    Abdominal Pain This is a new problem. The current episode started in the past 7 days. The onset quality is gradual. The problem occurs intermittently. The problem has been unchanged. The pain is located in the suprapubic region. The pain is mild. The quality of the pain is cramping. The abdominal pain does not radiate. Associated symptoms include dysuria. Pertinent negatives include no constipation, diarrhea, fever, frequency, headaches, myalgias, nausea or vomiting. Nothing aggravates the pain. The pain is relieved by nothing. She has tried nothing for the symptoms.    RN note: Pt began to have lower abd pain this even after getting up to go to the bathroom, denies vaginal bleeding or discharge, appx 7wk   Past Medical History:  Diagnosis Date  . Diabetes mellitus   . Vaginal cysts    Past Surgical History:  Procedure Laterality Date  . CESAREAN SECTION    . EYE SURGERY    . HIP SURGERY     r/hip   Social History   Socioeconomic History  . Marital status: Single    Spouse name: Not on file  . Number of children: Not on file  . Years of education: Not on file  . Highest education level: Not on file  Occupational History  . Not on file  Tobacco Use  . Smoking status: Never Smoker  . Smokeless tobacco: Never Used  Vaping Use  . Vaping Use: Never used  Substance and Sexual Activity  . Alcohol use: No    Alcohol/week: 0.0 standard drinks  . Drug use: No  . Sexual activity: Never    Birth control/protection:  None  Other Topics Concern  . Not on file  Social History Narrative   Works at a daycare. Lives in [place].   Social Determinants of Health   Financial Resource Strain: Not on file  Food Insecurity: Not on file  Transportation Needs: Not on file  Physical Activity: Not on file  Stress: Not on file  Social Connections: Not on file  Intimate Partner Violence: Not on file   No current facility-administered medications on file prior to encounter.   Current Outpatient Medications on File Prior to Encounter  Medication Sig Dispense Refill  . diclofenac Sodium (VOLTAREN) 1 % GEL Apply 2 g topically 4 (four) times daily. (Patient not taking: No sig reported) 50 g 0  . ibuprofen (ADVIL,MOTRIN) 800 MG tablet Take 1 tablet (800 mg total) by mouth 3 (three) times daily. (Patient not taking: No sig reported) 21 tablet 0  . insulin aspart protamine - aspart (NOVOLOG MIX 70/30 FLEXPEN) (70-30) 100 UNIT/ML FlexPen Inject 40 Units into the skin in the morning and at bedtime.    . metroNIDAZOLE (FLAGYL) 500 MG tablet Take 1 tablet (500 mg total) by mouth 2 (two) times daily. 14 tablet 0  . morphine (MSIR) 15 MG tablet Take 1 tablet (15 mg total) by mouth every 6 (six) hours as needed for severe pain. (Patient not taking: No sig reported) 6 tablet 0  .  phenazopyridine (PYRIDIUM) 200 MG tablet Take 1 tablet (200 mg total) by mouth 3 (three) times daily. (Patient not taking: No sig reported) 6 tablet 0   Allergies  Allergen Reactions  . Bactrim [Sulfamethoxazole-Trimethoprim] Swelling    unknown    I have reviewed patient's Past Medical Hx, Surgical Hx, Family Hx, Social Hx, medications and allergies.   ROS:  Review of Systems  Constitutional: Negative for fever.  Gastrointestinal: Positive for abdominal pain. Negative for constipation, diarrhea, nausea and vomiting.  Genitourinary: Positive for dysuria. Negative for frequency.  Musculoskeletal: Negative for myalgias.  Neurological: Negative  for headaches.   Review of Systems  Other systems negative   Physical Exam  Physical Exam Patient Vitals for the past 24 hrs:  BP Temp Pulse Resp SpO2  12/09/20 0431 102/61 98.2 F (36.8 C) 87 16 99 %  12/09/20 0331 108/73 97.9 F (36.6 C) 87 18 99 %   Constitutional: Well-developed, well-nourished female in no acute distress.  Cardiovascular: normal rate Respiratory: normal effort GI: Abd soft, non-tender. Pos BS x 4 MS: Extremities nontender, no edema, normal ROM Neurologic: Alert and oriented x 4.  GU: Neg CVAT.  PELVIC EXAM: Deferred due to patient volume and acuity on unit and nature of patients pain is primarily dysuria  LAB RESULTS Results for orders placed or performed during the hospital encounter of 12/09/20 (from the past 24 hour(s))  CBC     Status: Abnormal   Collection Time: 12/09/20  4:22 AM  Result Value Ref Range   WBC 6.9 4.0 - 10.5 K/uL   RBC 4.11 3.87 - 5.11 MIL/uL   Hemoglobin 11.0 (L) 12.0 - 15.0 g/dL   HCT 61.4 (L) 43.1 - 54.0 %   MCV 84.9 80.0 - 100.0 fL   MCH 26.8 26.0 - 34.0 pg   MCHC 31.5 30.0 - 36.0 g/dL   RDW 08.6 (H) 76.1 - 95.0 %   Platelets 403 (H) 150 - 400 K/uL   nRBC 0.0 0.0 - 0.2 %  hCG, quantitative, pregnancy     Status: Abnormal   Collection Time: 12/09/20  4:22 AM  Result Value Ref Range   hCG, Beta Chain, Quant, S 72,056 (H) <5 mIU/mL  ABO/Rh     Status: None   Collection Time: 12/09/20  4:22 AM  Result Value Ref Range   ABO/RH(D) O POS    No rh immune globuloin      NOT A RH IMMUNE GLOBULIN CANDIDATE, PT RH POSITIVE Performed at [facility], [address] [place], [place] [address]   Wet prep, genital     Status: Abnormal   Collection Time: 12/09/20  5:50 AM  Result Value Ref Range   Yeast Wet Prep HPF POC NONE SEEN NONE SEEN   Trich, Wet Prep NONE SEEN NONE SEEN   Clue Cells Wet Prep HPF POC NONE SEEN NONE SEEN   WBC, Wet Prep HPF POC MANY (A) NONE SEEN   Sperm NONE SEEN   Urinalysis, Routine w reflex  microscopic Urine, Clean Catch     Status: Abnormal   Collection Time: 12/09/20  6:30 AM  Result Value Ref Range   Color, Urine YELLOW YELLOW   APPearance CLOUDY (A) CLEAR   Specific Gravity, Urine 1.024 1.005 - 1.030   pH 5.0 5.0 - 8.0   Glucose, UA >=500 (A) NEGATIVE mg/dL   Hgb urine dipstick MODERATE (A) NEGATIVE   Bilirubin Urine NEGATIVE NEGATIVE   Ketones, ur 80 (A) NEGATIVE mg/dL  Protein, ur 100 (A) NEGATIVE mg/dL   Nitrite NEGATIVE NEGATIVE   Leukocytes,Ua LARGE (A) NEGATIVE   RBC / HPF >50 (H) 0 - 5 RBC/hpf   WBC, UA >50 (H) 0 - 5 WBC/hpf   Bacteria, UA NONE SEEN NONE SEEN   Squamous Epithelial / LPF 0-5 0 - 5      IMAGING US OB Comp Less 14 Wks  Result Date: 12/09/2020 CLINICAL DATA:  Initial evaluation for early pregnancy, dysuria. EXAM: OBSTETRIC <14 WK ULTRASOUND TECHNIQUE: Transabdominal ultrasound was performed for evaluation of the gestation as well as the maternal uterus and adnexal regions. COMPARISON:  None. FINDINGS: Intrauterine gestational sac: Single Yolk sac:  Present Embryo:  Present Cardiac Activity: Present Heart Rate: 135 bpm CRL: 9.2 mm   6 w 6 d                  [place] EDC: 07/29/2021 Subchorionic hemorrhage:  None visualized. Maternal uterus/adnexae: Ovaries within normal limits. Small corpus luteal cyst noted on the left. No adnexal mass or free fluid. IMPRESSION: 1. Single viable intrauterine pregnancy as above, estimated gestational age [redacted] weeks and 6 days by crown-rump length, with ultrasound EDC of 07/29/2021. No complication. 2. No other acute maternal uterine or adnexal abnormality. Electronically Signed   By: Rise Mu M.D.   On: 12/09/2020 05:15     MAU Management/MDM: Ordered usual first trimester r/o ectopic labs.   Pelvic cultures done Will check baseline Ultrasound to rule out ectopic.  This bleeding/pain can represent a normal pregnancy with bleeding, spontaneous abortion or even an ectopic which can be life-threatening.  The  process as listed above helps to determine which of these is present.  Reviewed results Will send urine for culture. WIll not treat for now as micro showed no bacteria.  Wet prep negative for Trich. Other tests will take 2 days  ASSESSMENT Single IUP at [redacted]w[redacted]d Dysuria Occasional cramps Type I DM  PLAN Discharge home List of offices given Will message [facility] to start care due to TIDM  Pt stable at time of discharge. Encouraged to return here if she develops worsening of symptoms, increase in pain, fever, or other concerning symptoms.    Wynelle Bourgeois CNM, MSN Certified Nurse-Midwife 12/09/2020  4:46 AM

## 2020-12-10 LAB — GC/CHLAMYDIA PROBE AMP (~~LOC~~) NOT AT ARMC
Chlamydia: NEGATIVE
Comment: NEGATIVE
Comment: NORMAL
Neisseria Gonorrhea: NEGATIVE

## 2020-12-10 LAB — CULTURE, OB URINE: Culture: 30000 — AB

## 2020-12-11 ENCOUNTER — Telehealth: Payer: Self-pay | Admitting: Medical

## 2020-12-11 ENCOUNTER — Other Ambulatory Visit: Payer: Self-pay | Admitting: Medical

## 2020-12-11 DIAGNOSIS — O99820 Streptococcus B carrier state complicating pregnancy: Secondary | ICD-10-CM | POA: Insufficient documentation

## 2020-12-11 DIAGNOSIS — R8271 Bacteriuria: Secondary | ICD-10-CM | POA: Insufficient documentation

## 2020-12-11 MED ORDER — AMOXICILLIN 500 MG PO TABS: 500.0000 mg | ORAL_TABLET | Freq: Three times a day (TID) | ORAL | 0 refills | Status: DC

## 2020-12-11 NOTE — Telephone Encounter (Signed)
LM for patient to return call to MAU for results. +GBS bacteruria. Rx sent. Patient needs to be informed of dx and tx.   Vonzella Nipple, PA-C 12/11/2020 10:09 AM

## 2020-12-16 ENCOUNTER — Telehealth: Payer: Self-pay | Admitting: Family Medicine

## 2020-12-16 NOTE — Telephone Encounter (Signed)
Returned patients call. Informed patient that she has a UTI and that antibiotics have been sent to her Pharmacy. ATB were sent to [facility], patient is to call [facility] [address] to have prescription transferred.   Patient to let the office know if she is not improving after taking the antibiotics.

## 2020-12-16 NOTE — Telephone Encounter (Signed)
Calling to get test results

## 2021-01-06 ENCOUNTER — Encounter: Payer: Self-pay | Admitting: *Deleted

## 2021-01-06 ENCOUNTER — Telehealth (INDEPENDENT_AMBULATORY_CARE_PROVIDER_SITE_OTHER): Payer: Medicaid Other | Admitting: *Deleted

## 2021-01-06 ENCOUNTER — Other Ambulatory Visit: Payer: Self-pay

## 2021-01-06 DIAGNOSIS — O24311 Unspecified pre-existing diabetes mellitus in pregnancy, first trimester: Secondary | ICD-10-CM

## 2021-01-06 DIAGNOSIS — Z3A00 Weeks of gestation of pregnancy not specified: Secondary | ICD-10-CM

## 2021-01-06 DIAGNOSIS — E1065 Type 1 diabetes mellitus with hyperglycemia: Secondary | ICD-10-CM

## 2021-01-06 DIAGNOSIS — E109 Type 1 diabetes mellitus without complications: Secondary | ICD-10-CM

## 2021-01-06 DIAGNOSIS — O24019 Pre-existing diabetes mellitus, type 1, in pregnancy, unspecified trimester: Secondary | ICD-10-CM

## 2021-01-06 DIAGNOSIS — Z98891 History of uterine scar from previous surgery: Secondary | ICD-10-CM

## 2021-01-06 DIAGNOSIS — N390 Urinary tract infection, site not specified: Secondary | ICD-10-CM | POA: Insufficient documentation

## 2021-01-06 DIAGNOSIS — O0990 Supervision of high risk pregnancy, unspecified, unspecified trimester: Secondary | ICD-10-CM

## 2021-01-06 DIAGNOSIS — E10319 Type 1 diabetes mellitus with unspecified diabetic retinopathy without macular edema: Secondary | ICD-10-CM

## 2021-01-06 DIAGNOSIS — O99820 Streptococcus B carrier state complicating pregnancy: Secondary | ICD-10-CM

## 2021-01-06 DIAGNOSIS — Z970 Presence of artificial eye: Secondary | ICD-10-CM

## 2021-01-06 MED ORDER — GOJJI WEIGHT SCALE MISC: 1.0000 | 0 refills | Status: AC | PRN

## 2021-01-06 MED ORDER — BLOOD PRESSURE KIT DEVI: 1.0000 | 0 refills | Status: AC | PRN

## 2021-01-06 NOTE — Patient Instructions (Signed)
  At our [facility] OB/GYN Practices, we work as an integrated team, providing care to address both physical and emotional health. Your medical provider may refer you to see our Behavioral Health Clinician [facility]) on the same day you see your medical provider, as availability permits; often scheduled virtually at your convenience.  Our [facility] is available to all patients, visits generally last between 20-30 minutes, but can be longer or shorter, depending on patient need. The [facility] offers help with stress management, coping with symptoms of depression and anxiety, major life changes , sleep issues, changing risky behavior, grief and loss, life stress, working on personal life goals, and  behavioral health issues, as these all affect your overall health and wellness.  The [facility] is NOT available for the following: FMLA paperwork, court-ordered evaluations, specialty assessments (custody or disability), letters to employers, or obtaining certification for an emotional support animal. The [facility] does not provide long-term therapy. You have the right to refuse integrated behavioral health services, or to reschedule to see the [facility] at a later date.  Confidentiality exception: If it is suspected that a child or disabled adult is being abused or neglected, we are required by law to report that to either Child Protective Services or Adult Management consultant.  If you have a diagnosis of Bipolar affective disorder, Schizophrenia, or recurrent Major depressive disorder, we will recommend that you establish care with a psychiatrist, as these are lifelong, chronic conditions, and we want your overall emotional health and medications to be more closely monitored. If you anticipate needing extended maternity leave due to mental health issues postpartum, it it recommended you inform your medical provider, so we can put in a referral to a psychiatrist as soon as possible. The [facility] is unable to recommend an extended maternity leave for mental  health issues. Your medical provider or [facility] may refer you to a therapist for ongoing, traditional therapy, or to a psychiatrist, for medication management, if it would benefit your overall health. Depending on your insurance, you may have a copay or be charged a deductible, depending on your insurance, to see the [facility]. If you are uninsured, it is recommended that you apply for financial assistance. (Forms may be requested at the front desk for in-person visits, via MyChart, or request a form during a virtual visit).  If you see the [facility] more than 6 times, you will have to complete a comprehensive clinical assessment interview with the [facility] to resume integrated services.  For virtual visits with the [facility], you must be physically in the state of [place] at the time of the visit. For example, if you live in [place], you will have to do an in-person visit with the [facility], and your out-of-state insurance may not cover behavioral health services in [place]. If you are going out of the state or country for any reason, the [facility] may see you virtually when you return to [place], but not while you are physically outside of [place].    [email or web address] is a great website with lots of information

## 2021-01-06 NOTE — Progress Notes (Signed)
8:29 Patient not connected for her virtual visit. I called her on her mobile number and left message we are calling for her visit and please join , or call and reschedule. Minaal Struckman,RN 0830 I called Lisa Barnes and she answered, I explained how to join virtually. Jerolene Kupfer,RN  New OB Intake  I connected with  Lisa Barnes on 01/06/21 at 8:30 by MyChart Video Visit and verified that I am speaking with the correct person using two identifiers. Nurse is located at [facility] and pt is located at home.  I discussed the limitations, risks, security and privacy concerns of performing an evaluation and management service by telephone and the availability of in person appointments. I also discussed with the patient that there may be a patient responsible charge related to this service. The patient expressed understanding and agreed to proceed.  I explained I am completing New OB Intake today. We discussed her EDD of 08/10/21 that is based on LMP of 10/23/20. Pt is G3/P2002. I reviewed her allergies, medications, Medical/Surgical/OB history, and appropriate screenings. I informed her of [facility] services. Based on history, this is a/an  pregnancy complicated by Diabetes Type 1  .   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   MyChart/Babyscripts MyChart access verified. I explained pt will have some visits in office and some virtually. Babyscripts instructions given and order placed. Patient verifies receipt of registration text/e-mail.   Blood Pressure Cuff  Blood pressure cuff ordered for patient  to pick-up from [employer]. Asked patient to pick up and bring to first appointment so we can show her how to use it. Explained after first prenatal appt pt will check weekly and document in Babyscripts.  Weight scale: Patient   does not  have weight scale. Weight scale ordered from [facility] for her to pick up.   Anatomy US Explained first scheduled [place] will be around 19 weeks. Anatomy US will be scheduled and patient notified by MyChart.   Labs Discussed Avelina Laine genetic screening with patient. Would like both Panorama and Horizon drawn at new OB visit. Routine prenatal labs needed.  Covid Vaccine Patient has not had the covid vaccine.   Mother/ Baby Dyad Candidate?   No If yes, offer as possibility  Inform patient of [facility] and place . In AVS   Social Determinants of Health Food Insecurity: Patient denies food insecurity. WIC Referral: sent patient message to see if she is  interested in referral to [facility] after visit today.  Transportation: Patient denies transportation needs. Childcare: Discussed no children allowed at ultrasound appointments. Offered childcare services; patient declines childcare services at this time.  First visit review I reviewed new OB appt with pt. I explained she will have a pelvic exam, ob bloodwork with genetic screening, and PAP smear. Explained pt will be seen by Dr. Crissie Reese at first visit; encounter routed to appropriate provider. Explained that patient will be seen by pregnancy navigator following visit with provider. [facility] information placed in AVS.   Tayon Parekh,RN 01/06/2021  10:50 AM

## 2021-01-13 ENCOUNTER — Ambulatory Visit (INDEPENDENT_AMBULATORY_CARE_PROVIDER_SITE_OTHER): Payer: Medicaid Other | Admitting: Family Medicine

## 2021-01-13 ENCOUNTER — Encounter: Payer: Self-pay | Admitting: Family Medicine

## 2021-01-13 ENCOUNTER — Other Ambulatory Visit (HOSPITAL_COMMUNITY)
Admission: RE | Admit: 2021-01-13 | Discharge: 2021-01-13 | Disposition: A | Discharge status: Home / Self Care | Payer: Medicaid Other | Source: Ambulatory Visit | Attending: Family Medicine | Admitting: Family Medicine

## 2021-01-13 ENCOUNTER — Other Ambulatory Visit: Payer: Self-pay

## 2021-01-13 VITALS — BP 104/75 | HR 88 | Wt 148.5 lb

## 2021-01-13 DIAGNOSIS — Z3A11 11 weeks gestation of pregnancy: Secondary | ICD-10-CM

## 2021-01-13 DIAGNOSIS — O0990 Supervision of high risk pregnancy, unspecified, unspecified trimester: Secondary | ICD-10-CM | POA: Diagnosis present

## 2021-01-13 DIAGNOSIS — Z98891 History of uterine scar from previous surgery: Secondary | ICD-10-CM

## 2021-01-13 DIAGNOSIS — E1065 Type 1 diabetes mellitus with hyperglycemia: Secondary | ICD-10-CM

## 2021-01-13 DIAGNOSIS — S32401S Unspecified fracture of right acetabulum, sequela: Secondary | ICD-10-CM

## 2021-01-13 DIAGNOSIS — E10319 Type 1 diabetes mellitus with unspecified diabetic retinopathy without macular edema: Secondary | ICD-10-CM

## 2021-01-13 DIAGNOSIS — R8271 Bacteriuria: Secondary | ICD-10-CM

## 2021-01-13 MED ORDER — ASPIRIN EC 81 MG PO TBEC: 81.0000 mg | DELAYED_RELEASE_TABLET | Freq: Every day | ORAL | 11 refills | Status: AC

## 2021-01-13 MED ORDER — PRENATAL PLUS 27-1 MG PO TABS: 1.0000 | ORAL_TABLET | Freq: Every day | ORAL | 11 refills | Status: DC
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
## 2021-01-13 NOTE — Patient Instructions (Addendum)
AREA PEDIATRIC/FAMILY PRACTICE PHYSICIANS  Central/Southeast [place] ([address]) [facility] Chambliss, MD; Eniola, MD; Hale, MD; Hensel, MD; McDiarmid, MD; McIntyer, MD; Neal, MD; Walden, MD [address] [place], [place] [address] [phone] Mon-Fri 8:30-12:30, 1:30-5:00 Providers come to see babies at [facility] Accepting Medicaid [facility] providers who accept newborns: Koirala, MD; Morrow, MD; Wolters, MD [address], [place], [place] [address] [phone] Mon-Fri 8:00-5:30 Babies seen by providers at [facility] Does NOT accept Medicaid Please call early in hospitalization for appointment (limited availability)  [facility] Mulberry, MD [address] [place], [place] [address] [phone] Mon, Tue, Thur, Fri 8:30-5:00, Wed 10:00-7:00 (closed 1-2pm) Babies seen by [facility] providers Accepting Medicaid Rubin - Pediatrician Rubin, MD [address], [place], [place] [address] [phone] Mon-Fri 8:30-5:00, Sat 8:30-12:00 Provider comes to see babies at [facility] Accepting Medicaid Must have been referred from current patients or contacted office prior to delivery [facility] ([facility]) Brown, MD; Chandler, MD; Ettefagh, MD; Grant, MD; Lester, MD; McCormick, MD; McQueen, MD; Prose, MD; Simha, MD; Stanley, MD; Stryffeler, NP; Tebben, NP [address], [place], [place] [address] [phone] Mon, Tue, Thur, Fri 8:30-5:30, Wed 9:30-5:30, Sat 8:30-12:30 Babies seen by [facility] providers Accepting Medicaid Only accepting infants of first-time parents or siblings of current patients Hospital discharge coordinator will make follow-up appointment Lisa Barnes [address], [place], [place]  [address] [phone]   Fax - [phone] [facility] [address], [place], [place]  [address] Phone - [phone]   Fax - [phone] Shilpa Gosrani [address], [place], [place]  [address] [phone]  East/[place]ortheast [place] ([address]) [facility] Bates, MD; Brassfield, MD; Cooper, Cox, MD; MD; Davis, MD; Dovico, MD; Ettefaugh, MD; Little, MD; Lowe, MD; Keiffer, MD; Melvin, MD; Sumner, MD; Williams, MD [address], [place], [place] [address] [phone] Mon-Fri 8:30-5:00 (extended evenings Mon-Thur as needed), Sat-Sun 10:00-1:00 Providers come to see babies at [facility] Accepting Medicaid for families of first-time babies and families with all children in the household age 3 and under. Must register with office prior to making appointment (M-F only). [facility] Henson, NP; Knapp, MD; Lalonde, MD; Tysinger, PA [address] [place], [place] [address] [phone] Mon-Fri 8:00-5:00 Babies seen by providers at [facility] Does NOT accept Medicaid/Commercial Insurance Only [facility] ([facility])  Artis, MD; Barnes, MD; Bratton, MD; Coccaro, MD; Lockett Gardner, MD; Kramer, MD; Marshall, MD; Netherton, MD; Poleto, MD; Skinner, MD [address] [place], [place] [address] [phone] Mon-Fri 8:30-5:30, Sat (Oct.-Mar.) 9:00-1:00 Babies seen by providers at [facility] Accepting Medicaid  [place] ([address]) [facility] Reid, MD; Warner, MD [address], [place], [place] [address] [phone] Mon-Fri 8:30-5:00, Sat 8:30-12:00 Providers come to see babies at [facility] Does NOT accept Medicaid [facility] Becker, PA; Hagler, MD; Scifres, PA; Sun, MD; Swayne, MD [address], [place], [place] [address] [phone] Mon-Fri 8:00-5:00 Babies seen by providers at [facility] Does NOT accept Medicaid Only accepting babies of parents who  are patients Please call early in hospitalization for appointment (limited availability) [facility] Clark, MD; Frye, MD; Kelleher, MD; Mack, NP; Miller, MD; O'Keller, MD; Patterson, NP; Pudlo, MD; Puzio, MD; Thomas, MD; Tucker, MD; Twiselton, MD [address], [place], [place] [address] [phone] Mon-Fri 8:00-5:00, Sat 9:00-12:00 Providers come to see babies at [facility] Does NOT accept Medicaid  [place] ([address]) [facility] providers accepting new patients: Brake, NP; Wharton, PA [address], [place], [place] [address] [phone] Mon-Fri 8:00-5:00 Babies seen by providers at [facility] Does NOT accept Medicaid Only accepting babies of parents who are patients Please call early in hospitalization for appointment (limited availability) [facility] Gay, MD; Quinlan, MD [address] [place], [place] [address] [phone] (press 1 to schedule appointment) Mon-Fri 8:00-5:00 Providers come to see babies at [facility] Does NOT accept Medicaid [facility] Mazer, MD [address] [place], [place] [address] [phone] Mon-Fri 8:30-5:00 (lunch 12:30-1:00), extended hours by appointment only Wed 5:00-6:30 Babies seen by [facility] providers Accepting Medicaid [facility] Banks, MD; Jordan, MD; Koberlein, MD [address], [place], [place] [address] [phone] Mon-Fri 8:00-5:00 Babies seen by [facility] providers Does NOT accept Medicaid [facility] Parker, MD; Hunter, MD; Wallace, DO [address] [place], [place] [address] [phone] Mon-Fri 8:00-5:00 Babies seen by [facility] providers Does NOT accept Medicaid [facility] Brandon, PA; Brecken, PA; Christy, NP; Dees, MD; DeClaire, MD; DeWeese, MD; Hansen, NP; Mills, NP; Parrish, NP; Smoot, NP; Summer, MD; Vapne,  MD [address] [place], [place] [address] [phone] Mon-Fri 8:30-5:00, Sat 10:00-1:00 Providers come to see babies at [facility] Does NOT accept Medicaid Free prenatal information session Tuesdays at 4:45pm [facility] Bouska, MD; Gordon, PA; Jeffery, PA; Weber, PA [address] [place] [address] [phone] Mon-Fri 7:30-5:30 Babies seen by [facility] providers [facility] [address], [place], [place]  [address] [phone]   Fax - [phone]  [place] ([address] & [address]) [facility] [place] [address] [place], [place] [address] [phone] Mon-Thur 8:00-6:00 Providers come to see babies at [facility] Accepting Medicaid [facility] Anderson, NP; Badger, MD; Beal, PA; Spencer, PA [address] [place], [place] [address] [phone] Mon-Thur 7:30-7:30, Fri 7:30-4:30 Babies seen by [facility] providers Accepting Medicaid [facility] Agbuya, MD; Klett, NP; Romgoolam, MD [address], [place], [place] [address] [phone] Mon-Fri 8:30-5:00, Sat 8:30-12:00 Providers come to see babies at [facility] Accepting Medicaid Must have "Meet & Greet" appointment at office prior to delivery [facility] ([facility]) McCord, MD; Wallace, MD; Wood, MD [address], [place], [place] [address] [phone] Mon-Wed 8:00-6:00, Thur-Fri 8:00-5:00, Sat 9:00-12:00 Providers come to see babies at [facility] Does NOT accept Medicaid Only accepting siblings of current patients [facility]  [address], [place], [place]  [address] [phone]   Fax - [phone] [facility] [address], [place], [place]  [address] [phone]   Fax -  [phone]  [place] ([address] & [address]) [facility] Cirigliano, DO; Matthews, DO [address] [place], [place] [address] [phone] Mon-Fri 7:00-5:00 Babies seen by [facility] providers Does NOT accept Medicaid [facility] Briscoe, MD; Howley, PA; Moreira, PA [address], [place],  [address] [phone] Mon-Fri 8:00-5:00 Babies seen by [facility] providers Accepting Medicaid [facility] Boyd, MD; Church, PA; Jones, NP; Osborn, PA [address], ,  [address] [phone] Mon-Fri 8:00-5:00 Babies seen by providers at [facility] Accepting Medicaid  [place] ([address]) [facility] [place] Wendling, DO [address] [place], [place] [address] [phone] Mon-Fri 8:00-5:00 Babies seen by [facility] providers Does NOT accept Medicaid Limited availability, please call early in hospitalization to schedule follow-up [facility] Calderon, PA; Cummings, MD; Dillard, MD; Martin, PA; Olson, MD; VanDeven, PA [address], [place], [place] [address] [phone] Mon-Fri 8:30-5:00, Sat 9:00-12:00 Babies seen by providers at [facility] Accepting Medicaid Please register online then schedule online or call office [email or web address]) Hunter, NP; Kumar, MD; Martin Rogers, PA [address], [place], [place] [address] [phone] Mon-Fri 8:00-5:00 Babies seen by providers at [facility] Accepting Medicaid [facility] ([facility]) Glasgow, MD; Kristi Fleenor, NP; West, MD [address], [place], [place] [address] [phone] Mon-Fri 8:00-5:30, Sat&Sun by appointment (phones open at  8:30) Babies seen by [facility] providers Accepting Medicaid Must be a first-time baby or sibling of current patient [facility] [place]  [address], [place], [place]  [address] [phone]   Fax - [phone]  [place] ([address]) [facility] Brown, PA; Cowen, PA; Rice, MD; Helton, PA; Spry, MD [address] [place], [place] [address] [phone] Mon-Thur 8:00-7:00, Fri 8:00-5:00, Sat 8:00-12:00, Sun 9:00-12:00 Babies seen by [facility] providers Accepting Medicaid [facility] Coe-Goins, MD; Marshall, MD; Pierre-Louis, MD [address], [place], [place] [address] [phone] Mon-Thur 8:00-5:00 Babies seen by providers at [facility] Accepting Medicaid [facility] Bratton, MD; Coe-Goins, MD; Hayes, MD; Lewis, MD; List, MD; Lott, MD; Marshall, MD; Moran, MD; O'Neal, MD; Pierre-Louis, MD; Pitonzo, MD; Scholer, MD; Spangle, MD [address] [place], [place] [address] [phone] Mon-Fri 8:00-5:30, Sat (Oct.-Mar.) 9:00-1:00 Babies seen by providers at [facility] Accepting Medicaid Must fill out new patient packet, available online at [email or web address] [facility] ([facility]) Friddle, NP; Harris, NP; Kelly, NP; Logan, MD; Melvin, PA; Poth, MD; Ramadoss, MD; Stanton, NP [address], [place], [place] [address] [phone] Mon-Thur 8:00-5:30, Fri 8:00-5:00 Babies seen by providers at [facility] Accepting Medicaid  [place] ([address]) [facility] Dixon, PA; Kalaheo, MD; Pickard, MD; Tapia, PA [address], [place], [place] [address] [phone] Mon-Fri 8:00-5:00 Babies seen by providers at [facility] Accepting Medicaid   [place] ([address]) [facility] [place] Masneri, DO; Meyers, MD; Nelson, PA [address], [place], [place] [address] [phone] Mon-Fri 8:00-5:00 Babies seen by providers at [facility] Does NOT accept Medicaid Limited appointment availability, please call early in hospitalization   [facility] [place] Kunedd, DO; McGowen, MD [address], [place], [place] [address] [phone] Mon-Fri 8:00-5:00 Babies seen by [facility] providers Does NOT accept Medicaid [facility] [place] Cameron, MD; MacDonald, MD; Michaels, PA; Nayak, MD [address] [place] [address], [place], [place] [address] [phone] Mon-Fri 8:00-5:00 After hours clinic ([address] [place], [place] [address]) [phone] Mon-Fri 5:00-8:00, Sat 12:00-6:00, Sun 10:00-4:00 Babies seen by [facility] providers Accepting Medicaid [facility] [place] [address], [place], [place]  [address] [phone]   Fax - [phone]  [place] [phone]) Adult nurse [facility], MD [address] [place], [place], [place] [address] [phone] Mon-Fri 8:00-5:00 Babies seen by [facility] providers Does NOT accept Medicaid [facility] [place] [facility] [place]) Rene Kocher, MD [address] [place], [place] [address] [phone] Mon-Thur 8:00-7:00, Fri 8:00-5:00, Sat 8:00-12:00 Babies seen by providers at [facility] Accepting Medicaid - but does not have vaccinations in office (must be received elsewhere) Limited availability, please call early in hospitalization  [place] [phone]) [facility]  Wyvonne Lenz, MD [address], [place] [address] [phone]  Fax [phone]  Second Trimester of Pregnancy  The second trimester of pregnancy is from week 13 through week 27. This is months 4 through 6 of pregnancy. The second trimester is often a time when you feel your best. Your  body has adjusted to being pregnant, and you begin to feelbetter physically. During the second trimester: Morning sickness has lessened or stopped completely. You may have more energy. You may have an increase in appetite. The second trimester is also a time when the unborn baby (fetus) is growing rapidly. At the end of the sixth month, the fetus may be up to 12 inches long and weigh about 1 pounds. You will likely begin to feel the baby move (quickening) between 16 and 20 weeks of pregnancy. Body changes during your second trimester Your body continues to go through many changes during your second trimester.The changes vary and generally return to normal after the baby is born. Physical changes Your weight will continue to increase. You will notice your lower abdomen bulging out. You may begin to get stretch marks on your hips, abdomen, and breasts. Your breasts will continue to grow and to become tender. Dark spots or blotches (chloasma or mask of pregnancy) may develop on your face. A dark line from your belly button to the pubic area (linea nigra) may appear. You may have changes in your hair. These can include thickening of your hair, rapid growth, and changes in texture. Some people also have hair loss during or after pregnancy, or hair that feels dry or thin. Health changes You may develop headaches. You may have heartburn. You may develop constipation. You may develop hemorrhoids or swollen, bulging veins (varicose veins). Your gums may bleed and may be sensitive to brushing and flossing. You may urinate more often because the fetus is pressing on your bladder. You may have back pain. This is caused by: Weight gain. Pregnancy hormones that are relaxing the joints in your pelvis. A shift in weight and the muscles that support your balance. Follow these instructions at home: Medicines Follow your health care provider's instructions regarding medicine use. Specific medicines may be  either safe or unsafe to take during pregnancy. Do not take any medicines unless approved by your health care provider. Take a prenatal vitamin that contains at least 600 micrograms (mcg) of folic acid. Eating and drinking Eat a healthy diet that includes fresh fruits and vegetables, whole grains, good sources of protein such as meat, eggs, or tofu, and low-fat dairy products. Avoid raw meat and unpasteurized juice, milk, and cheese. These carry germs that can harm you and your baby. You may need to take these actions to prevent or treat constipation: Drink enough fluid to keep your urine pale yellow. Eat foods that are high in fiber, such as beans, whole grains, and fresh fruits and  vegetables. Limit foods that are high in fat and processed sugars, such as fried or sweet foods. Activity Exercise only as directed by your health care provider. Most people can continue their usual exercise routine during pregnancy. Try to exercise for 30 minutes at least 5 days a week. Stop exercising if you develop contractions in your uterus. Stop exercising if you develop pain or cramping in the lower abdomen or lower back. Avoid exercising if it is very hot or humid or if you are at a high altitude. Avoid heavy lifting. If you choose to, you may have sex unless your health care provider tells you not to. Relieving pain and discomfort Wear a supportive bra to prevent discomfort from breast tenderness. Take warm sitz baths to soothe any pain or discomfort caused by hemorrhoids. Use hemorrhoid cream if your health care provider approves. Rest with your legs raised (elevated) if you have leg cramps or low back pain. If you develop varicose veins: Wear support hose as told by your health care provider. Elevate your feet for 15 minutes, 3-4 times a day. Limit salt in your diet. Safety Wear your seat belt at all times when driving or riding in a car. Talk with your health care provider if someone is verbally or  physically abusive to you. Lifestyle Do not use hot tubs, steam rooms, or saunas. Do not douche. Do not use tampons or scented sanitary pads. Avoid cat litter boxes and soil used by cats. These carry germs that can cause birth defects in the baby and possibly loss of the fetus by miscarriage or stillbirth. Do not use herbal remedies, alcohol, illegal drugs, or medicines that are not approved by your health care provider. Chemicals in these products can harm your baby. Do not use any products that contain nicotine or tobacco, such as cigarettes, e-cigarettes, and chewing tobacco. If you need help quitting, ask your health care provider. General instructions During a routine prenatal visit, your health care provider will do a physical exam and other tests. He or she will also discuss your overall health. Keep all follow-up visits. This is important. Ask your health care provider for a referral to a local prenatal education class. Ask for help if you have counseling or nutritional needs during pregnancy. Your health care provider can offer advice or refer you to specialists for help with various needs. Where to find more information American Pregnancy Association: americanpregnancy.org [employer]st[employer]ians and Gynecologists: https://www.todd-brady.net/acog.org/en/Womens%20Health/Pregnancy Office on [employer]'s Health: MightyReward.co.nzwomenshealth.gov/pregnancy Contact a health care provider if you have: A headache that does not go away when you take medicine. Vision changes or you see spots in front of your eyes. Mild pelvic cramps, pelvic pressure, or nagging pain in the abdominal area. Persistent nausea, vomiting, or diarrhea. A bad-smelling vaginal discharge or foul-smelling urine. Pain when you urinate. Sudden or extreme swelling of your face, hands, ankles, feet, or legs. A fever. Get help right away if you: Have fluid leaking from your vagina. Have spotting or bleeding from your vagina. Have severe abdominal cramping or  pain. Have difficulty breathing. Have chest pain. Have fainting spells. Have not felt your baby move for the time period told by your health care provider. Have new or increased pain, swelling, or redness in an arm or leg. Summary The second trimester of pregnancy is from week 13 through week 27 (months 4 through 6). Do not use herbal remedies, alcohol, illegal drugs, or medicines that are not approved by your health care provider. Chemicals in  these products can harm your baby. Exercise only as directed by your health care provider. Most people can continue their usual exercise routine during pregnancy. Keep all follow-up visits. This is important. This information is not intended to replace advice given to you by your health care provider. Make sure you discuss any questions you have with your healthcare provider. Document Revised: 12/12/2019 Document Reviewed: 10/18/2019 Elsevier Patient Education  2022 Elsevier Inc.  Group B Streptococcus Infection During Pregnancy Group B Streptococcus (GBS) is a type of bacteria that is often found in healthy people. It is commonly found in the rectum, vagina, and intestines. In people who are healthy and not pregnant, the bacteria rarely cause serious illness or complications. However, women who test positive for GBS during pregnancy can pass the bacteria to the baby during childbirth. This can cause serious infection in the babyafter birth. Women with GBS may also have infections during their pregnancy or soon after childbirth. The infections include urinary tract infections (UTIs) or infections of the uterus. GBS also increases a woman's risk of complications during pregnancy, such as early labor or delivery, miscarriage, or stillbirth.Routine testing for GBS is recommended for all pregnant women. What are the causes? This condition is caused by bacteria called Streptococcus agalactiae. What increases the risk? You may have a higher risk for GBS  infection during pregnancy if you had oneduring a past pregnancy. What are the signs or symptoms? In most cases, GBS infection does not cause symptoms in pregnant women. If symptoms exist, they may include: Labor that starts before the 37th week of pregnancy. A UTI or bladder infection. This may cause a fever, frequent urination, or pain and burning during urination. Fever during labor. There can also be a rapid heartbeat in the mother or baby. Rare but serious symptoms of a GBS infection in women include: Blood infection (septicemia). This may cause fever, chills, or confusion. Lung infection (pneumonia). This may cause fever, chills, cough, rapid breathing, chest pain, or difficulty breathing. Bone, joint, skin, or soft tissue infection. How is this diagnosed? You may be screened for GBS between week 35 and week 37 of pregnancy. If you have symptoms of preterm labor, you may be screened earlier. This condition is diagnosed based on lab test results from: A swab of fluid from the vagina and rectum. A urine sample. How is this treated? This condition is treated with antibiotic medicine. Antibiotic medicine may be given: To you when you go into labor, or as soon as your water breaks. The medicines will continue until after you give birth. If you are having a cesarean delivery, you do not need antibiotics unless your water has broken. To your baby, if he or she requires treatment. Your health care provider will check your baby to decide if he or she needs antibiotics to prevent a serious infection. Follow these instructions at home: Take over-the-counter and prescription medicines only as told by your health care provider. Take your antibiotic medicine as told by your health care provider. Do not stop taking the antibiotic even if you start to feel better. Keep all pre-birth (prenatal) visits and follow-up visits as told by your health care provider. This is important. Contact a health care  provider if: You have pain or burning when you urinate. You have to urinate more often than usual. You have a fever or chills. You develop a bad-smelling vaginal discharge. Get help right away if: Your water breaks. You go into labor. You have severe pain in your  abdomen. You have difficulty breathing. You have chest pain. These symptoms may represent a serious problem that is an emergency. Do not wait to see if the symptoms will go away. Get medical help right away. Call your local emergency services (911 in the [place] Do not drive yourself to the hospital. Summary GBS is a type of bacteria that is common in healthy people. During pregnancy, colonization with GBS can cause serious complications for you or your baby. Your health care provider will screen you between 35 and 37 weeks of pregnancy to determine if you are colonized with GBS. If you are colonized with GBS during pregnancy, your health care provider will recommend antibiotics through an IV during labor. After delivery, your baby will be evaluated for complications related to potential GBS infection and may require antibiotics to prevent a serious infection. This information is not intended to replace advice given to you by your health care provider. Make sure you discuss any questions you have with your healthcare provider. Document Revised: 05/06/2020 Document Reviewed: 01/29/2019 Elsevier Patient Education  2022 [employer].  Contraception Choices Contraception, also called birth control, refers to methods or devices thatprevent pregnancy. Hormonal methods  Contraceptive implant A contraceptive implant is a thin, plastic tube that contains a hormone that prevents pregnancy. It is different from an intrauterine device (IUD). It is inserted into the upper part of the arm by a health care provider. Implants canbe effective for up to 3 years. Progestin-only injections Progestin-only injections are injections of progestin, a  synthetic form of thehormone progesterone. They are given every 3 months by a health care provider. Birth control pills Birth control pills are pills that contain hormones that prevent pregnancy. They must be taken once a day, preferably at the same time each day. Aprescription is needed to use this method of contraception. Birth control patch The birth control patch contains hormones that prevent pregnancy. It is placed on the skin and must be changed once a week for three weeks and removed on thefourth week. A prescription is needed to use this method of contraception. Vaginal ring A vaginal ring contains hormones that prevent pregnancy. It is placed in the vagina for three weeks and removed on the fourth week. After that, the process is repeated with a new ring. A prescription is needed to use this method ofcontraception. Emergency contraceptive Emergency contraceptives prevent pregnancy after unprotected sex. They come in pill form and can be taken up to 5 days after sex. They work best the sooner they are taken after having sex. Most emergency contraceptives are available without a prescription. This method should not be used as your only form ofbirth control. Barrier methods  Female condom A female condom is a thin sheath that is worn over the penis during sex. Condoms keep sperm from going inside a woman's body. They can be used with a sperm-killing substance (spermicide) to increase their effectiveness. They should be thrown away after one use. Female condom A female condom is a soft, loose-fitting sheath that is put into the vagina before sex. The condom keeps sperm from going inside a woman's body. Theyshould be thrown away after one use. Diaphragm A diaphragm is a soft, dome-shaped barrier. It is inserted into the vagina before sex, along with a spermicide. The diaphragm blocks sperm from entering the uterus, and the spermicide kills sperm. A diaphragm should be left in thevagina for 6-8 hours  after sex and removed within 24 hours. A diaphragm is prescribed and fitted by a  health care provider. A diaphragm should be replaced every 1-2 years, after giving birth, after gaining more than15 lb (6.8 kg), and after pelvic surgery. Cervical cap A cervical cap is a round, soft latex or plastic cup that fits over the cervix. It is inserted into the vagina before sex, along with spermicide. It blocks sperm from entering the uterus. The cap should be left in place for 6-8 hours after sex and removed within 48 hours. A cervical cap must be prescribed andfitted by a health care provider. It should be replaced every 2 years. Sponge A sponge is a soft, circular piece of polyurethane foam with spermicide in it. The sponge helps block sperm from entering the uterus, and the spermicide kills sperm. To use it, you make it wet and then insert it into the vagina. It should be inserted before sex, left in for at least 6 hours after sex, and removed andthrown away within 30 hours. Spermicides Spermicides are chemicals that kill or block sperm from entering the cervix and uterus. They can come as a cream, jelly, suppository, foam, or tablet. A spermicide should be inserted into the vagina with an applicator at least 10-15 minutes before sex to allow time for it to work. The process must be repeatedevery time you have sex. Spermicides do not require a prescription. Intrauterine contraception Intrauterine device (IUD) An IUD is a T-shaped device that is put in a woman's uterus. There are two types: Hormone IUD.This type contains progestin, a synthetic form of the hormone progesterone. This type can stay in place for 3-5 years. Copper IUD.This type is wrapped in copper wire. It can stay in place for 10 years. Permanent methods of contraception Female tubal ligation In this method, a woman's fallopian tubes are sealed, tied, or blocked duringsurgery to prevent eggs from traveling to the uterus. Hysteroscopic  sterilization In this method, a small, flexible insert is placed into each fallopian tube. The inserts cause scar tissue to form in the fallopian tubes and block them, so sperm cannot reach an egg. The procedure takes about 3 months to be effective.Another form of birth control must be used during those 3 months. Female sterilization This is a procedure to tie off the tubes that carry sperm (vasectomy). After the procedure, the man can still ejaculate fluid (semen). Another form of birth control must be used for 3 months after the procedure. Natural planning methods Natural family planning In this method, a couple does not have sex on days when the woman could become pregnant. Calendar method In this method, the woman keeps track of the length of each menstrual cycle, identifies the days when pregnancy can happen, and does not have sex on those days. Ovulation method In this method, a couple avoids sex during ovulation. Symptothermal method This method involves not having sex during ovulation. The woman typically checks for ovulation bywatching changes in her temperature and in the consistency of cervical mucus. Post-ovulation method In this method, a couple waits to have sex until after ovulation. Where to find more information Centers for Disease Control and Prevention: [employer] Summary Contraception, also called birth control, refers to methods or devices that prevent pregnancy. Hormonal methods of contraception include implants, injections, pills, patches, vaginal rings, and emergency contraceptives. Barrier methods of contraception can include female condoms, female condoms, diaphragms, cervical caps, sponges, and spermicides. There are two types of IUDs (intrauterine devices). An IUD can be put in a woman's uterus to prevent pregnancy for 3-5 years. Permanent sterilization can be done  through a procedure for males and females. Natural family planning methods involve nothaving sex on days  when the woman could become pregnant. This information is not intended to replace advice given to you by your health care provider. Make sure you discuss any questions you have with your healthcare provider. Document Revised: 12/10/2019 Document Reviewed: 12/10/2019 Elsevier Patient Education  2022 [employer].

## 2021-01-15 LAB — CBC/D/PLT+RPR+RH+ABO+RUBIGG...
Basophils Absolute: 0 10*3/uL (ref 0.0–0.2)
Basos: 1 %
EOS (ABSOLUTE): 0.1 10*3/uL (ref 0.0–0.4)
Eos: 1 %
HCV Ab: <0.1 s/co ratio (ref 0.0–0.9)
HIV Screen 4th Generation wRfx: NONREACTIVE
Hematocrit: 34.1 % (ref 34.0–46.6)
Hemoglobin: 11.1 g/dL (ref 11.1–15.9)
Hepatitis B Surface Ag: NEGATIVE
Immature Grans (Abs): 0 10*3/uL (ref 0.0–0.1)
Immature Granulocytes: 0 %
Lymphocytes Absolute: 2.1 10*3/uL (ref 0.7–3.1)
Lymphs: 47 %
MCH: 28 pg (ref 26.6–33.0)
MCHC: 32.6 g/dL (ref 31.5–35.7)
MCV: 86 fL (ref 79–97)
Monocytes Absolute: 0.2 10*3/uL (ref 0.1–0.9)
Monocytes: 5 %
Neutrophils Absolute: 2 10*3/uL (ref 1.4–7.0)
Neutrophils: 46 %
Platelets: 408 10*3/uL (ref 150–450)
RBC: 3.96 x10E6/uL (ref 3.77–5.28)
RDW: 15.2 % (ref 11.7–15.4)
RPR Ser Ql: NONREACTIVE
Rh Factor: POSITIVE
Rubella Antibodies, IGG: 1.96 index (ref >0.99)
WBC: 4.4 10*3/uL (ref 3.4–10.8)

## 2021-01-15 LAB — HEMOGLOBIN A1C
Est. average glucose Bld gHb Est-mCnc: 146 mg/dL
Hgb A1c MFr Bld: 6.7 % — ABNORMAL HIGH (ref 4.8–5.6)

## 2021-01-15 LAB — SPECIMEN STATUS REPORT

## 2021-01-15 LAB — AB SCR+ANTIBODY ID
Antibody Screen: POSITIVE — AB
Coombs Titer #1: 4

## 2021-01-15 LAB — CYTOLOGY - PAP: Diagnosis: NEGATIVE

## 2021-01-15 LAB — HCV INTERPRETATION

## 2021-01-16 LAB — URINE CULTURE, OB REFLEX

## 2021-01-16 LAB — CULTURE, OB URINE

## 2021-01-17 ENCOUNTER — Encounter: Payer: Self-pay | Admitting: Family Medicine

## 2021-01-17 DIAGNOSIS — O361990 Maternal care for other isoimmunization, unspecified trimester, not applicable or unspecified: Secondary | ICD-10-CM | POA: Insufficient documentation

## 2021-01-22 ENCOUNTER — Other Ambulatory Visit: Payer: Medicaid Other

## 2021-02-04 ENCOUNTER — Encounter: Payer: Self-pay | Admitting: *Deleted

## 2021-02-05 ENCOUNTER — Telehealth: Payer: Self-pay

## 2021-02-05 NOTE — Telephone Encounter (Signed)
Called Pt to go over Horizon test results, she asked if I could call her back in an hour.

## 2021-02-12 ENCOUNTER — Encounter: Payer: Medicaid Other | Admitting: Family Medicine

## 2021-02-12 ENCOUNTER — Encounter: Payer: Self-pay | Admitting: Family Medicine

## 2021-02-12 NOTE — Progress Notes (Signed)
Patient did not keep appointment today. She will be called to reschedule.  

## 2021-03-03 ENCOUNTER — Encounter: Payer: Medicaid Other | Admitting: Family Medicine

## 2021-03-09 ENCOUNTER — Encounter: Payer: Self-pay | Admitting: *Deleted

## 2021-03-09 ENCOUNTER — Other Ambulatory Visit: Payer: Self-pay

## 2021-03-09 ENCOUNTER — Ambulatory Visit: Payer: Medicaid Other | Admitting: *Deleted

## 2021-03-09 ENCOUNTER — Ambulatory Visit (HOSPITAL_BASED_OUTPATIENT_CLINIC_OR_DEPARTMENT_OTHER): Payer: Medicaid Other | Admitting: Obstetrics

## 2021-03-09 ENCOUNTER — Other Ambulatory Visit: Payer: Self-pay | Admitting: Family Medicine

## 2021-03-09 ENCOUNTER — Ambulatory Visit: Payer: Medicaid Other | Attending: Family Medicine

## 2021-03-09 VITALS — BP 111/67 | HR 78

## 2021-03-09 DIAGNOSIS — Z3A19 19 weeks gestation of pregnancy: Secondary | ICD-10-CM | POA: Diagnosis not present

## 2021-03-09 DIAGNOSIS — E10319 Type 1 diabetes mellitus with unspecified diabetic retinopathy without macular edema: Secondary | ICD-10-CM

## 2021-03-09 DIAGNOSIS — E1065 Type 1 diabetes mellitus with hyperglycemia: Secondary | ICD-10-CM | POA: Insufficient documentation

## 2021-03-09 DIAGNOSIS — O24012 Pre-existing diabetes mellitus, type 1, in pregnancy, second trimester: Secondary | ICD-10-CM | POA: Diagnosis not present

## 2021-03-09 DIAGNOSIS — O0990 Supervision of high risk pregnancy, unspecified, unspecified trimester: Secondary | ICD-10-CM | POA: Diagnosis not present

## 2021-03-09 DIAGNOSIS — B951 Streptococcus, group B, as the cause of diseases classified elsewhere: Secondary | ICD-10-CM

## 2021-03-09 DIAGNOSIS — R8271 Bacteriuria: Secondary | ICD-10-CM

## 2021-03-09 DIAGNOSIS — Z98891 History of uterine scar from previous surgery: Secondary | ICD-10-CM

## 2021-03-09 DIAGNOSIS — O24311 Unspecified pre-existing diabetes mellitus in pregnancy, first trimester: Secondary | ICD-10-CM

## 2021-03-09 DIAGNOSIS — O2342 Unspecified infection of urinary tract in pregnancy, second trimester: Secondary | ICD-10-CM

## 2021-03-09 DIAGNOSIS — O361920 Maternal care for other isoimmunization, second trimester, not applicable or unspecified: Secondary | ICD-10-CM

## 2021-03-09 NOTE — Progress Notes (Signed)
MFM Note  Dessa Ledee was seen for a detailed fetal anatomy scan due to pregestational diabetes that is treated with insulin.  The patient reports that she was diagnosed with diabetes at age 30 (23 years ago).  She has a history of diabetic retinopathy (Whites classification Class R) that has been treated with laser surgery of both eyes.  She has yearly appointments with her ophthalmologist.  Her hemoglobin A1c drawn earlier in her pregnancy was 6.7%.  She has an appointment with endocrinology next month to discuss the possibility of having an insulin pump and continuous glucose monitor placed.  Her urine culture was GBS positive earlier in her pregnancy.  Her blood type is O+.  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  She denies any other significant past medical history and denies any problems in her current pregnancy.    She had a cell free DNA test earlier in her pregnancy which indicated a low risk for trisomy 71, 9, and 13. A female fetus is predicted.   She was informed that the fetal growth and amniotic fluid level were appropriate for her gestational age.   There were no obvious fetal anomalies noted on today's ultrasound exam.  However, today's exam was limited due to the fetal position.  The patient was informed that anomalies may be missed due to technical limitations. If the fetus is in a suboptimal position or maternal habitus is increased, visualization of the fetus in the maternal uterus may be impaired.  During our consultation today the following were discussed:  Pregestational diabetes (Class R) and pregnancy  The implications and management of diabetes in pregnancy was discussed in detail with the patient. She was advised that our goals for her blood glucose levels are fasting values of 90-95 or less and two-hour postprandials of 120 or less.    The patient was advised that getting her fingerstick values as close to these  goals as possible would provide her with the most optimal obstetrical outcome.  The increased risk of fetal macrosomia, preeclampsia, and polyhydramnios due to diabetes in pregnancy was discussed.  She was also advised regarding the increased risk of congenital anomalies and congenital heart defects associated with pregestational diabetes.  The patient was advised that as she has diabetic retinopathy which may indicate significant vascular disease associated with diabetes, that there may be an increased risk for fetal growth restriction and an indicated preterm birth.   She should have a baseline P/C ratio performed and also have a metabolic panel to assess her kidney function.  Due to diabetes in pregnancy, we will continue to follow her with serial growth ultrasounds.  She was referred to [facility] pediatric cardiology for a fetal echocardiogram.  Weekly to twice weekly nonstress tests should be started at around 32 weeks and continued until delivery.    Due to her history of retinopathy, she should see her ophthalmologist at least once during her current pregnancy.  The patient was advised that should the fetal growth appear within normal limits and her fingerstick values are mostly in the normal range, that delivery at around 39 weeks is usually recommended.  However, if her glycemic control is poor or if should she have elevated blood pressures, delivery at 37 weeks may be necessary.  As women with diabetes may be at increased risk for developing preeclampsia, she should start taking a daily baby aspirin (81 mg daily) as soon as possible.  Anti-Kell antibody titer levels of  1:4  The significance of testing positive for the anti-Kell antibody was discussed today.    She was advised that the anti-Kell antibodies that are present in her blood can cross the placenta and cause significant anemia in her fetus if the fetus has Kell antigens (inherited from the father) on its red blood cells.  She should  have the father of the baby screened to determine if he carries the Kell antigen on his red blood cells.   She does not know the Kell antigen status of her 2 other children.  As the anti-Kell antibodies have the potential to cause significant fetal anemia, we will continue to follow her with MCA Doppler studies every 2 weeks to screen for fetal anemia.  She was reassured that the MCA Doppler studies performed today did not indicate that her fetus is anemic at this time.  There were no signs of fetal hydrops noted today.  She understands that should fetal anemia be suspected later in her pregnancy, that an intrauterine fetal blood transfusion may be necessary.  GBS positive urine culture  The patient will require antibiotics for GBS prophylaxis at the time of delivery.  A follow-up exam was scheduled in 2 weeks for MCA Doppler studies.  We will reassess the fetal growth again in 4 weeks.  The patient stated that all of her questions had been answered today.  A total of 45 minutes was spent counseling and coordinating the care for this patient.  Greater than 50% of the time was spent in direct face-to-face contact.  Recommendations: Start daily baby aspirin for preeclampsia prophylaxis Continue insulin for glycemic control throughout her pregnancy  Baseline P/C ratio and metabolic panel to assess renal function Ophthalmology evaluation at least once during her pregnancy Fetal echocardiogram Monthly growth ultrasound Weekly to twice weekly fetal testing starting at 32 weeks Delivery at between 37 to 39 weeks depending on her blood pressures and glycemic control Have the father of the baby screened to determine if he carries the Kell antigen on his red blood cells MCA Doppler studies to screen for fetal anemia every 2 weeks Antibiotics for GBS prophylaxis at the time of delivery

## 2021-03-10 ENCOUNTER — Encounter (HOSPITAL_COMMUNITY): Payer: Self-pay | Admitting: *Deleted

## 2021-03-10 ENCOUNTER — Inpatient Hospital Stay (HOSPITAL_COMMUNITY)
Admission: AD | Admit: 2021-03-10 | Discharge: 2021-03-10 | Disposition: A | Discharge status: Home / Self Care | Payer: Medicaid Other | Attending: Family Medicine | Admitting: Family Medicine

## 2021-03-10 DIAGNOSIS — O24019 Pre-existing diabetes mellitus, type 1, in pregnancy, unspecified trimester: Secondary | ICD-10-CM | POA: Insufficient documentation

## 2021-03-10 DIAGNOSIS — N898 Other specified noninflammatory disorders of vagina: Secondary | ICD-10-CM | POA: Diagnosis present

## 2021-03-10 DIAGNOSIS — O26899 Other specified pregnancy related conditions, unspecified trimester: Secondary | ICD-10-CM | POA: Diagnosis not present

## 2021-03-10 LAB — URINALYSIS, ROUTINE W REFLEX MICROSCOPIC
Bilirubin Urine: NEGATIVE
Glucose, UA: 150 mg/dL — AB
Hgb urine dipstick: NEGATIVE
Ketones, ur: NEGATIVE mg/dL
Nitrite: NEGATIVE
Protein, ur: NEGATIVE mg/dL
Specific Gravity, Urine: 1.017 (ref 1.005–1.030)
pH: 7 (ref 5.0–8.0)

## 2021-03-10 LAB — WET PREP, GENITAL
Clue Cells Wet Prep HPF POC: NONE SEEN
Sperm: NONE SEEN
Trich, Wet Prep: NONE SEEN
Yeast Wet Prep HPF POC: NONE SEEN

## 2021-03-10 MED ORDER — TERCONAZOLE 0.8 % VA CREA: 1.0000 | TOPICAL_CREAM | Freq: Every day | VAGINAL | 0 refills | Status: AC

## 2021-03-10 MED ORDER — PREPLUS 27-1 MG PO TABS: 1.0000 | ORAL_TABLET | Freq: Every day | ORAL | 13 refills | Status: AC

## 2021-03-10 NOTE — MAU Note (Addendum)
Thinks she has a yeast infection. Mucus d/c and itching. States blood sugars have been good.

## 2021-03-10 NOTE — MAU Provider Note (Addendum)
Event Date/Time   First Provider Initiated Contact with Patient 03/10/21 0957      S [name] is a 30 y.o. [phone] patient who presents to MAU today with chief complaint of vaginal itching and abnormal vaginal discharge.  She believes that she has a yeast infection.  This is a new problem, onset within the past week. Patient denies pain, vaginal bleeding, dysuria, fever or recent illness.  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  O BP 99/70 (BP Location: Right Arm)   Pulse 82   Temp 97.9 F (36.6 C) (Oral)   Resp 16   Ht 5\' 5"  (1.651 m)   Wt 69 kg   LMP 10/23/2020   SpO2 99%   BMI 25.31 kg/m    Physical Exam Vitals and nursing note reviewed. Exam conducted with a chaperone present.  Constitutional:      Appearance: Normal appearance.  Cardiovascular:     Rate and Rhythm: Normal rate.     Pulses: Normal pulses.  Pulmonary:     Effort: Pulmonary effort is normal.  Abdominal:     Comments: Gravid  Skin:    Capillary Refill: Capillary refill takes less than 2 seconds.  Neurological:     Mental Status: She is alert and oriented to person, place, and time.  Psychiatric:        Mood and Affect: Mood normal.        Behavior: Behavior normal.        Thought Content: Thought content normal.        Judgment: Judgment normal.    A Medical screening exam complete FHT 146 by Doppler Patient evaluated in triage due to absence of acute complaint Patient to self swab Will treat presumptively for yeast per patient request CNM will follow-up as PRN   Results for orders placed or performed during the hospital encounter of 03/10/21 (from the past 24 hour(s))  Urinalysis, Routine w reflex microscopic     Status: Abnormal   Collection Time: 03/10/21 10:13 AM  Result Value Ref Range   Color, Urine YELLOW YELLOW   APPearance HAZY (A) CLEAR   Specific Gravity, Urine 1.017 1.005 - 1.030    pH 7.0 5.0 - 8.0   Glucose, UA 150 (A) NEGATIVE mg/dL   Hgb urine dipstick NEGATIVE NEGATIVE   Bilirubin Urine NEGATIVE NEGATIVE   Ketones, ur NEGATIVE NEGATIVE mg/dL   Protein, ur NEGATIVE NEGATIVE mg/dL   Nitrite NEGATIVE NEGATIVE   Leukocytes,Ua LARGE (A) NEGATIVE   RBC / HPF 0-5 0 - 5 RBC/hpf   WBC, UA 0-5 0 - 5 WBC/hpf   Bacteria, UA RARE (A) NONE SEEN   Squamous Epithelial / LPF 0-5 0 - 5   Mucus PRESENT   Wet prep, genital     Status: Abnormal   Collection Time: 03/10/21 10:14 AM  Result Value Ref Range   Yeast Wet Prep HPF POC NONE SEEN NONE SEEN   Trich, Wet Prep NONE SEEN NONE SEEN   Clue Cells Wet Prep HPF POC NONE SEEN NONE SEEN   WBC, Wet Prep HPF POC MANY (A) NONE SEEN   Sperm NONE SEEN    Meds ordered this encounter  Medications   Prenatal Vit-Fe Fumarate-FA (PREPLUS) 27-1 MG TABS    Sig: Take 1 tablet by mouth daily.    Dispense:  30 tablet    Refill:  13    Order  Specific Question:   Supervising Provider    Answer:   Myna Hidalgo [[id number]]   terconazole (TERAZOL 3) 0.8 % vaginal cream    Sig: Place 1 applicator vaginally at bedtime. Apply nightly for three nights.    Dispense:  20 g    Refill:  0    Order Specific Question:   Supervising Provider    Answer:   Myna Hidalgo [[id number]]    P Discharge from MAU in stable condition Warning signs for worsening condition that would warrant emergency follow-up discussed Patient may return to MAU as needed   Clayton Bibles, CNM 03/10/2021 1:54 PM

## 2021-03-10 NOTE — MAU Note (Signed)
Not taking the prenatal vitamins, "correct one for her insurance hasn't been called in,", not taking low dose ASA- "doesn't want to take anything that could harm the baby", attempted to explain why this was ordered,? Understanding.

## 2021-03-11 LAB — GC/CHLAMYDIA PROBE AMP (~~LOC~~) NOT AT ARMC
Chlamydia: NEGATIVE
Comment: NEGATIVE
Comment: NORMAL
Neisseria Gonorrhea: NEGATIVE

## 2021-03-18 ENCOUNTER — Encounter: Payer: Medicaid Other | Admitting: Obstetrics and Gynecology

## 2021-03-31 NOTE — Progress Notes (Deleted)
Patient did not keep appointment. Will have staff contact patient to reschedule.

## 2021-04-17 ENCOUNTER — Ambulatory Visit: Payer: Medicaid Other | Admitting: Internal Medicine

## 2021-04-22 ENCOUNTER — Encounter: Payer: Medicaid Other | Admitting: Family Medicine

## 2021-04-24 ENCOUNTER — Encounter: Payer: Self-pay | Admitting: Family Medicine

## 2021-04-24 NOTE — Progress Notes (Signed)
Patient did not keep appointment 04/22/2021. She will be called to reschedule.

## 2022-01-22 IMAGING — US US OB COMP LESS 14 WK
1 series · 15 of 28 positions shown · non-contrast
Comparison: None.

CLINICAL DATA: Initial evaluation for early pregnancy, dysuria.

EXAM:
OBSTETRIC <14 WK ULTRASOUND
TECHNIQUE: Transabdominal ultrasound was performed for evaluation of the
gestation as well as the maternal uterus and adnexal regions.

[Series 1: us ob comp less 14 wk · 29 acquisitions, 15 frames shown]
[im 1/29]
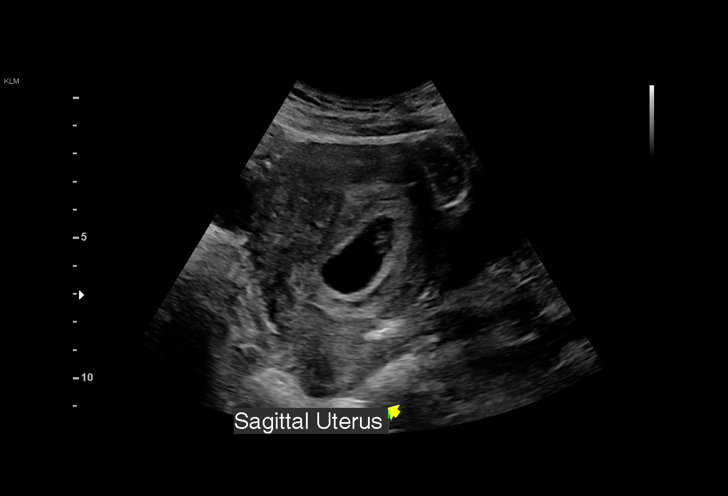
[im 3/29]
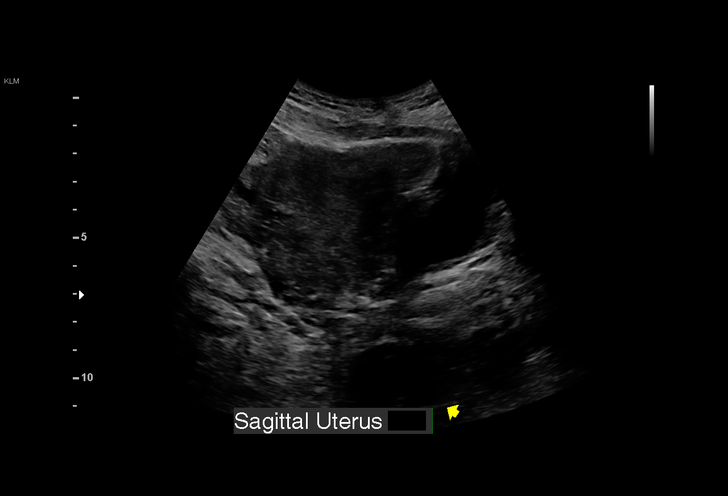
[im 5/29]
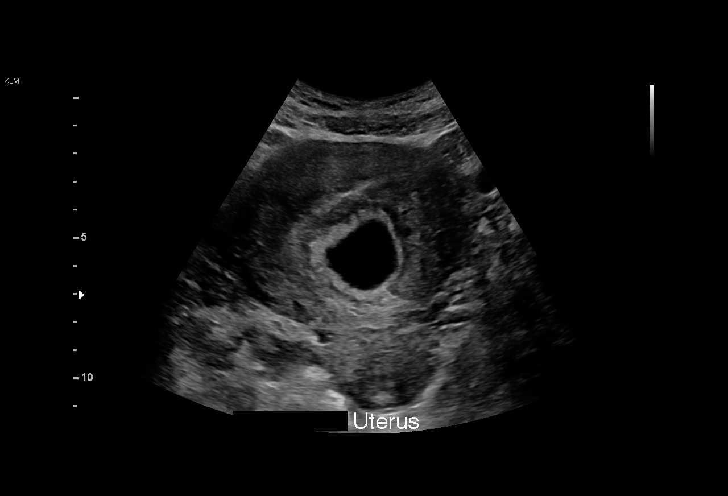
[im 7/29]
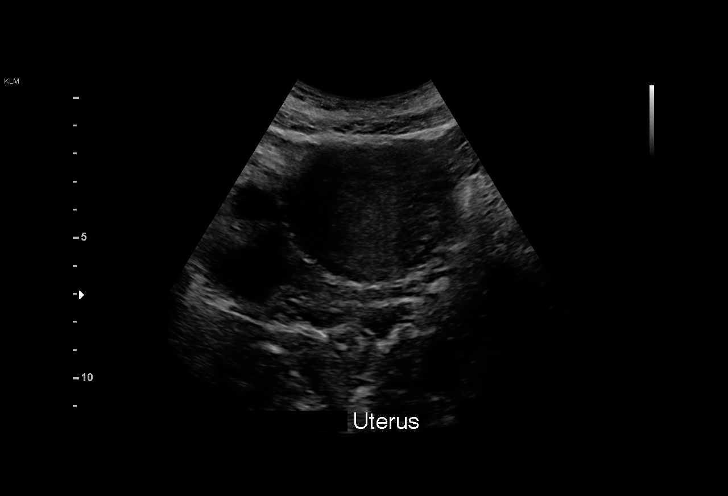
[im 9/29]
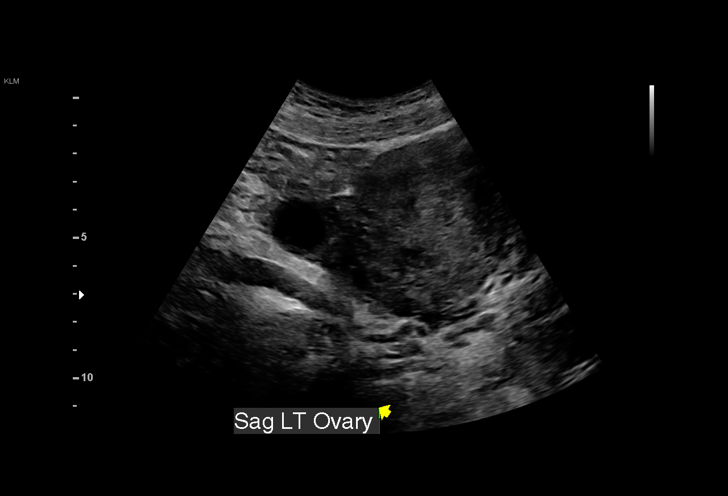
[im 11/29]
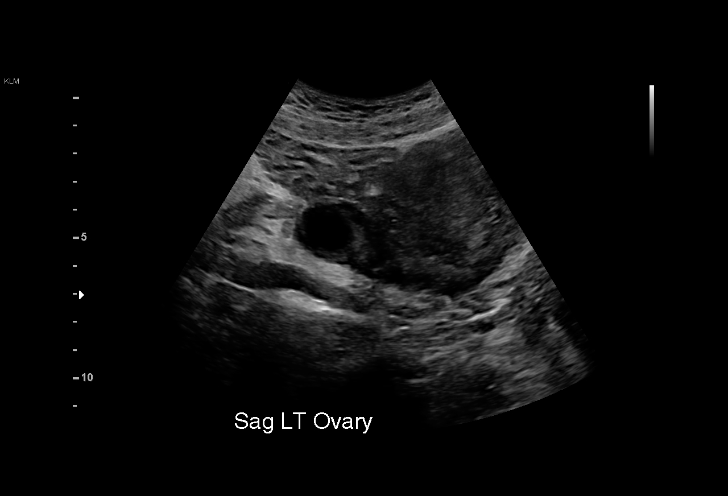
[im 13/29]
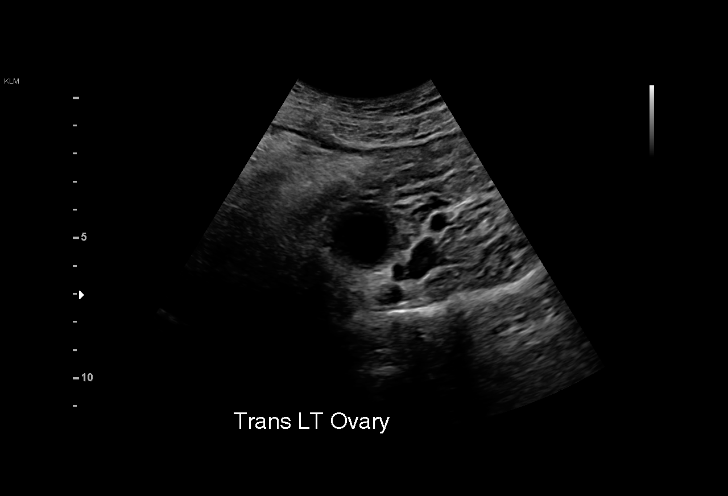
[im 15/29]
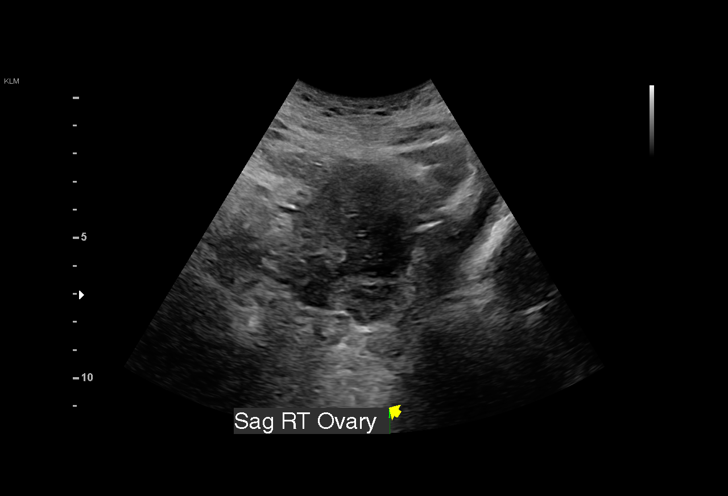
[im 16/29]
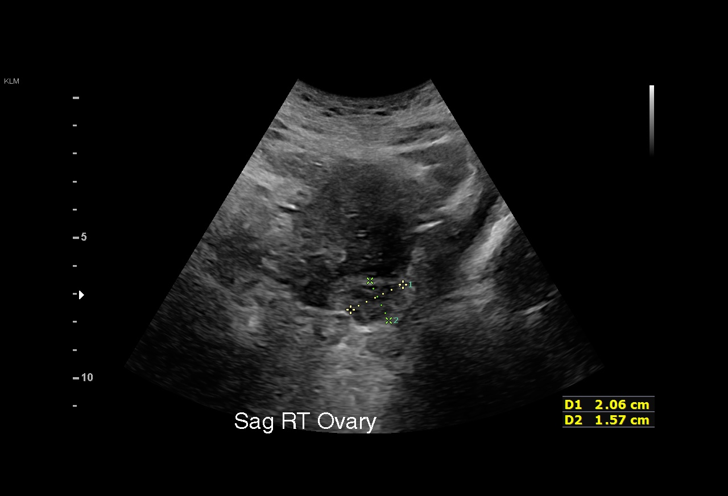
[im 18/29]
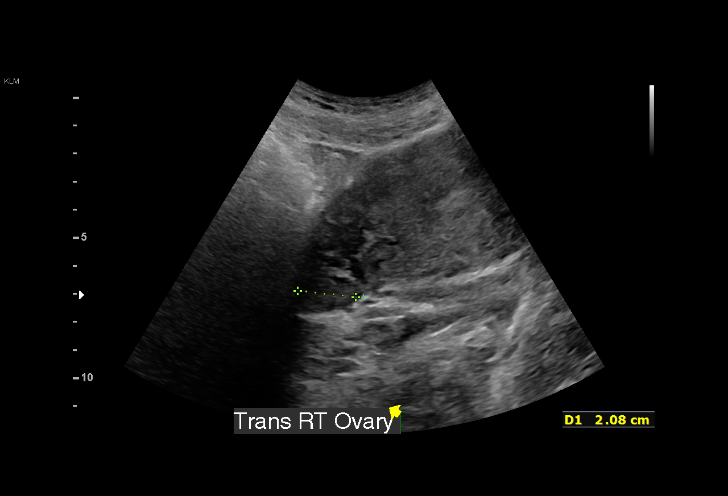
[im 20/29]
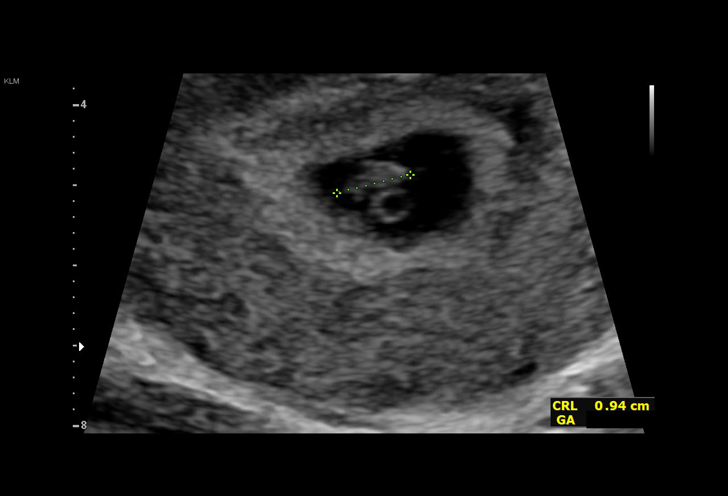
[im 22/29]
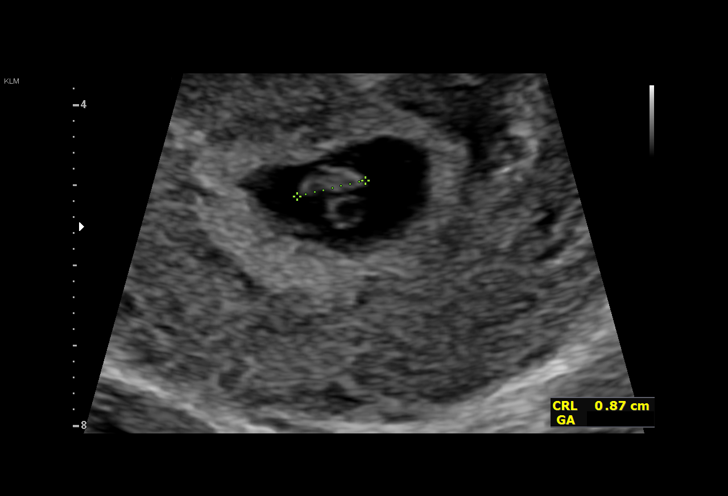
[im 24/29]
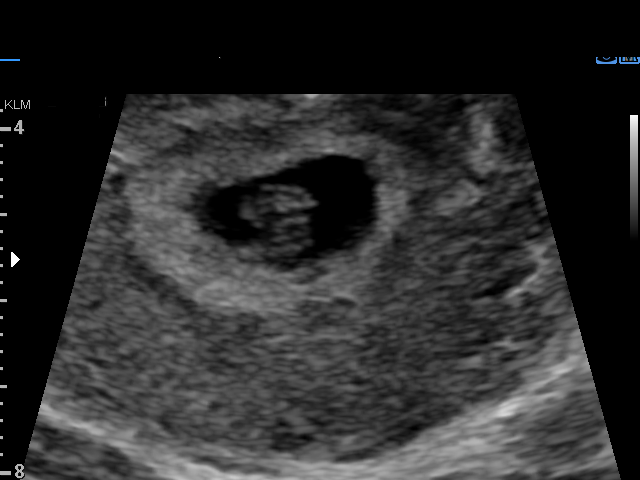
[im 26/29]
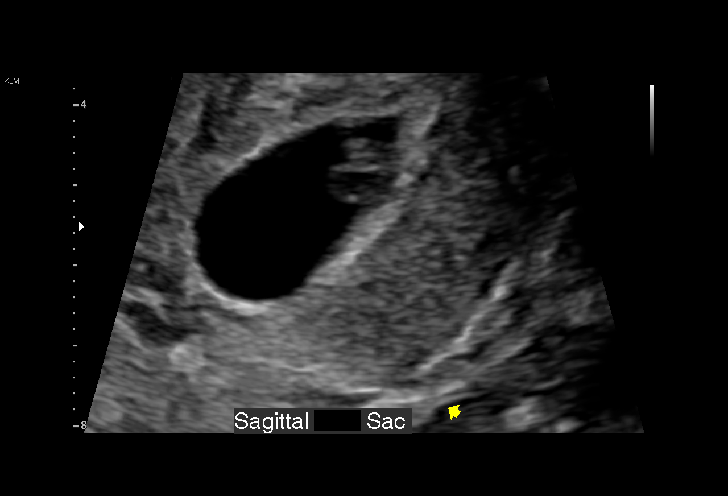
[im 29/29]
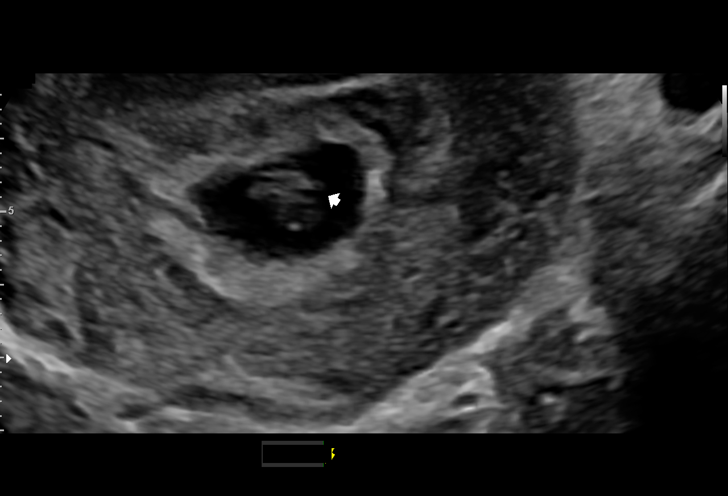

[15 of 28 positions shown; findings below may reference images not displayed]

FINDINGS: Intrauterine gestational sac: Single

Yolk sac:  Present

Embryo:  Present

Cardiac Activity: Present

Heart Rate: 135 bpm

CRL: 9.2 mm   6 w 6 d                  US EDC: 07/29/2021

Subchorionic hemorrhage:  None visualized.

Maternal uterus/adnexae: Ovaries within normal limits. Small corpus
luteal cyst noted on the left. No adnexal mass or free fluid.
IMPRESSION: 1. Single viable intrauterine pregnancy as above, estimated
gestational age 6 weeks and 6 days by crown-rump length, with
ultrasound EDC of 07/29/2021. No complication.
2. No other acute maternal uterine or adnexal abnormality.

## 2022-07-16 ENCOUNTER — Encounter

## 2022-07-16 NOTE — Telephone Encounter (Signed)
error 

## 2022-07-21 ENCOUNTER — Encounter: Attending: Family
